# Patient Record
Sex: Male | Born: 1945 | Race: Black or African American | Hispanic: No | Marital: Married | Smoking: Never smoker
Health system: Southern US, Community
[De-identification: ages and names within clinical notes are randomized; demographics above are authoritative.]

## PROBLEM LIST (undated history)

## (undated) DIAGNOSIS — I472 Ventricular tachycardia, unspecified: Secondary | ICD-10-CM

## (undated) DIAGNOSIS — Z8639 Personal history of other endocrine, nutritional and metabolic disease: Secondary | ICD-10-CM

## (undated) DIAGNOSIS — K219 Gastro-esophageal reflux disease without esophagitis: Secondary | ICD-10-CM

## (undated) DIAGNOSIS — R7303 Prediabetes: Secondary | ICD-10-CM

## (undated) DIAGNOSIS — G629 Polyneuropathy, unspecified: Secondary | ICD-10-CM

## (undated) DIAGNOSIS — E785 Hyperlipidemia, unspecified: Secondary | ICD-10-CM

## (undated) DIAGNOSIS — I10 Essential (primary) hypertension: Secondary | ICD-10-CM

## (undated) DIAGNOSIS — G4730 Sleep apnea, unspecified: Secondary | ICD-10-CM

## (undated) DIAGNOSIS — I2510 Atherosclerotic heart disease of native coronary artery without angina pectoris: Secondary | ICD-10-CM

## (undated) DIAGNOSIS — R002 Palpitations: Secondary | ICD-10-CM

## (undated) DIAGNOSIS — R519 Headache, unspecified: Secondary | ICD-10-CM

## (undated) DIAGNOSIS — N189 Chronic kidney disease, unspecified: Secondary | ICD-10-CM

## (undated) DIAGNOSIS — I4729 Other ventricular tachycardia: Secondary | ICD-10-CM

## (undated) DIAGNOSIS — N400 Enlarged prostate without lower urinary tract symptoms: Secondary | ICD-10-CM

## (undated) HISTORY — PX: SHOULDER SURGERY: SHX246

## (undated) HISTORY — DX: Prediabetes: R73.03

## (undated) HISTORY — DX: Headache, unspecified: R51.9

## (undated) HISTORY — DX: Polyneuropathy, unspecified: G62.9

## (undated) HISTORY — DX: Atherosclerotic heart disease of native coronary artery without angina pectoris: I25.10

## (undated) HISTORY — DX: Ventricular tachycardia, unspecified: I47.20

## (undated) HISTORY — DX: Gilbert syndrome: E80.4

## (undated) HISTORY — DX: Chronic kidney disease, unspecified: N18.9

## (undated) HISTORY — DX: Other ventricular tachycardia: I47.29

## (undated) HISTORY — PX: COLON SURGERY: SHX602

## (undated) HISTORY — DX: Personal history of other endocrine, nutritional and metabolic disease: Z86.39

## (undated) HISTORY — PX: ABLATION OF DYSRHYTHMIC FOCUS: SHX254

## (undated) HISTORY — DX: Gastro-esophageal reflux disease without esophagitis: K21.9

## (undated) HISTORY — PX: NECK SURGERY: SHX720

## (undated) HISTORY — PX: COLONOSCOPY: SHX174

## (undated) HISTORY — PX: ROTATOR CUFF REPAIR: SHX139

---

## 2002-10-07 HISTORY — PX: ROTATOR CUFF REPAIR: SHX139

## 2007-08-06 ENCOUNTER — Emergency Department (HOSPITAL_COMMUNITY): Admission: EM | Admit: 2007-08-06 | Discharge: 2007-08-06 | Payer: Self-pay | Admitting: *Deleted

## 2007-10-08 HISTORY — PX: NECK SURGERY: SHX720

## 2011-02-14 ENCOUNTER — Emergency Department (HOSPITAL_COMMUNITY): Payer: BC Managed Care – PPO

## 2011-02-14 ENCOUNTER — Emergency Department (HOSPITAL_COMMUNITY)
Admission: EM | Admit: 2011-02-14 | Discharge: 2011-02-14 | Disposition: A | Discharge status: Home / Self Care | Payer: BC Managed Care – PPO | Attending: Emergency Medicine | Admitting: Emergency Medicine

## 2011-02-14 DIAGNOSIS — M549 Dorsalgia, unspecified: Secondary | ICD-10-CM | POA: Insufficient documentation

## 2011-02-14 DIAGNOSIS — R300 Dysuria: Secondary | ICD-10-CM | POA: Insufficient documentation

## 2011-02-14 DIAGNOSIS — Z79899 Other long term (current) drug therapy: Secondary | ICD-10-CM | POA: Insufficient documentation

## 2011-02-14 DIAGNOSIS — IMO0002 Reserved for concepts with insufficient information to code with codable children: Secondary | ICD-10-CM | POA: Insufficient documentation

## 2011-02-14 DIAGNOSIS — I10 Essential (primary) hypertension: Secondary | ICD-10-CM | POA: Insufficient documentation

## 2011-02-14 DIAGNOSIS — R10819 Abdominal tenderness, unspecified site: Secondary | ICD-10-CM | POA: Insufficient documentation

## 2011-02-14 DIAGNOSIS — R109 Unspecified abdominal pain: Secondary | ICD-10-CM | POA: Insufficient documentation

## 2011-02-14 LAB — URINALYSIS, ROUTINE W REFLEX MICROSCOPIC
Bilirubin Urine: NEGATIVE
Hgb urine dipstick: NEGATIVE
Ketones, ur: NEGATIVE mg/dL
Nitrite: NEGATIVE
Specific Gravity, Urine: 1.017 (ref 1.005–1.030)

## 2011-02-14 LAB — COMPREHENSIVE METABOLIC PANEL
Alkaline Phosphatase: 61 U/L (ref 39–117)
CO2: 22 mEq/L (ref 19–32)
GFR calc Af Amer: >60 mL/min (ref >60)
GFR calc non Af Amer: >60 mL/min (ref >60)
Total Bilirubin: 1.1 mg/dL (ref 0.3–1.2)
Total Protein: 7.8 g/dL (ref 6.0–8.3)

## 2011-02-14 LAB — DIFFERENTIAL
Eosinophils Absolute: 0.2 10*3/uL (ref 0.0–0.7)
Monocytes Absolute: 0.5 10*3/uL (ref 0.1–1.0)
Neutro Abs: 3.2 10*3/uL (ref 1.7–7.7)

## 2011-02-14 LAB — CBC
HCT: 39 % (ref 39.0–52.0)
Hemoglobin: 14 g/dL (ref 13.0–17.0)
MCV: 88.6 fL (ref 78.0–100.0)
Platelets: 173 10*3/uL (ref 150–400)
RDW: 12 % (ref 11.5–15.5)
WBC: 5.5 10*3/uL (ref 4.0–10.5)

## 2011-02-14 MED ORDER — IOHEXOL 300 MG/ML  SOLN
100.0000 mL | Freq: Once | INTRAMUSCULAR | Status: AC | PRN
  Administered 2011-02-14: 100 mL via INTRAVENOUS

## 2011-06-20 ENCOUNTER — Emergency Department (HOSPITAL_COMMUNITY)
Admission: EM | Admit: 2011-06-20 | Discharge: 2011-06-20 | Disposition: A | Discharge status: Home / Self Care | Payer: BC Managed Care – PPO | Attending: Emergency Medicine | Admitting: Emergency Medicine

## 2011-06-20 DIAGNOSIS — R109 Unspecified abdominal pain: Secondary | ICD-10-CM | POA: Insufficient documentation

## 2011-06-20 DIAGNOSIS — N509 Disorder of male genital organs, unspecified: Secondary | ICD-10-CM | POA: Insufficient documentation

## 2011-06-20 DIAGNOSIS — I10 Essential (primary) hypertension: Secondary | ICD-10-CM | POA: Insufficient documentation

## 2011-06-20 DIAGNOSIS — M549 Dorsalgia, unspecified: Secondary | ICD-10-CM | POA: Insufficient documentation

## 2011-06-20 DIAGNOSIS — G8929 Other chronic pain: Secondary | ICD-10-CM | POA: Insufficient documentation

## 2011-06-20 LAB — CBC
HCT: 39.6 % (ref 39.0–52.0)
MCHC: 36.4 g/dL — ABNORMAL HIGH (ref 30.0–36.0)
MCV: 88.6 fL (ref 78.0–100.0)
Platelets: 184 10*3/uL (ref 150–400)
WBC: 8.5 10*3/uL (ref 4.0–10.5)

## 2011-06-20 LAB — URINALYSIS, ROUTINE W REFLEX MICROSCOPIC
Glucose, UA: NEGATIVE mg/dL
Ketones, ur: NEGATIVE mg/dL
Specific Gravity, Urine: 1.016 (ref 1.005–1.030)
Urobilinogen, UA: 0.2 mg/dL (ref 0.0–1.0)

## 2011-06-20 LAB — BASIC METABOLIC PANEL
BUN: 13 mg/dL (ref 6–23)
Chloride: 102 mEq/L (ref 96–112)
GFR calc Af Amer: >60 mL/min (ref >60)
GFR calc non Af Amer: >60 mL/min (ref >60)
Glucose, Bld: 90 mg/dL (ref 70–99)

## 2011-06-20 LAB — DIFFERENTIAL
Eosinophils Absolute: 0.2 10*3/uL (ref 0.0–0.7)
Lymphocytes Relative: 33 % (ref 12–46)
Lymphs Abs: 2.8 10*3/uL (ref 0.7–4.0)
Monocytes Absolute: 0.6 10*3/uL (ref 0.1–1.0)
Neutro Abs: 4.8 10*3/uL (ref 1.7–7.7)

## 2011-07-17 LAB — I-STAT 8, (EC8 V) (CONVERTED LAB)
BUN: 12
Glucose, Bld: 87
HCT: 45
Hemoglobin: 15.3
Operator id: 146091
Potassium: 4.4
Sodium: 140
TCO2: 27

## 2011-07-17 LAB — URINALYSIS, ROUTINE W REFLEX MICROSCOPIC
Bilirubin Urine: NEGATIVE
Hgb urine dipstick: NEGATIVE
Ketones, ur: NEGATIVE
Nitrite: NEGATIVE
Protein, ur: NEGATIVE
Urobilinogen, UA: 0.2

## 2011-07-17 LAB — DIFFERENTIAL
Basophils Relative: 1
Neutro Abs: 5
Neutrophils Relative %: 65

## 2011-07-17 LAB — CBC
HCT: 43.3
Hemoglobin: 14.7
Platelets: 242
RDW: 12.8
WBC: 7.7

## 2011-07-17 LAB — POCT I-STAT CREATININE: Creatinine, Ser: 1.1

## 2011-10-10 ENCOUNTER — Ambulatory Visit (INDEPENDENT_AMBULATORY_CARE_PROVIDER_SITE_OTHER): Payer: BC Managed Care – PPO | Admitting: Otolaryngology

## 2011-10-10 DIAGNOSIS — J039 Acute tonsillitis, unspecified: Secondary | ICD-10-CM

## 2011-10-31 ENCOUNTER — Ambulatory Visit (INDEPENDENT_AMBULATORY_CARE_PROVIDER_SITE_OTHER): Payer: BC Managed Care – PPO | Admitting: Otolaryngology

## 2011-10-31 DIAGNOSIS — J3501 Chronic tonsillitis: Secondary | ICD-10-CM

## 2011-11-28 ENCOUNTER — Ambulatory Visit (INDEPENDENT_AMBULATORY_CARE_PROVIDER_SITE_OTHER): Payer: BC Managed Care – PPO | Admitting: Otolaryngology

## 2011-11-28 DIAGNOSIS — J3501 Chronic tonsillitis: Secondary | ICD-10-CM

## 2011-11-28 DIAGNOSIS — J351 Hypertrophy of tonsils: Secondary | ICD-10-CM

## 2011-11-28 DIAGNOSIS — R070 Pain in throat: Secondary | ICD-10-CM

## 2011-12-17 ENCOUNTER — Encounter (HOSPITAL_BASED_OUTPATIENT_CLINIC_OR_DEPARTMENT_OTHER): Payer: Self-pay | Admitting: *Deleted

## 2011-12-23 ENCOUNTER — Ambulatory Visit (HOSPITAL_BASED_OUTPATIENT_CLINIC_OR_DEPARTMENT_OTHER): Admission: RE | Admit: 2011-12-23 | Payer: BC Managed Care – PPO | Source: Ambulatory Visit | Admitting: Otolaryngology

## 2011-12-23 ENCOUNTER — Encounter (HOSPITAL_BASED_OUTPATIENT_CLINIC_OR_DEPARTMENT_OTHER): Admission: RE | Payer: Self-pay | Source: Ambulatory Visit

## 2011-12-23 HISTORY — DX: Essential (primary) hypertension: I10

## 2011-12-23 HISTORY — DX: Sleep apnea, unspecified: G47.30

## 2011-12-23 HISTORY — DX: Palpitations: R00.2

## 2011-12-23 SURGERY — TONSILLECTOMY AND ADENOIDECTOMY
Anesthesia: General | Laterality: Bilateral

## 2012-01-14 ENCOUNTER — Encounter (HOSPITAL_COMMUNITY): Payer: Self-pay | Admitting: Pharmacy Technician

## 2012-01-15 ENCOUNTER — Other Ambulatory Visit: Payer: Self-pay | Admitting: Otolaryngology

## 2012-01-22 ENCOUNTER — Encounter (HOSPITAL_COMMUNITY)
Admission: RE | Admit: 2012-01-22 | Discharge: 2012-01-22 | Disposition: A | Discharge status: Home / Self Care | Payer: Medicare Other | Source: Ambulatory Visit | Attending: Anesthesiology | Admitting: Anesthesiology

## 2012-01-22 ENCOUNTER — Encounter (HOSPITAL_COMMUNITY)
Admission: RE | Admit: 2012-01-22 | Discharge: 2012-01-22 | Disposition: A | Discharge status: Home / Self Care | Payer: Medicare Other | Source: Ambulatory Visit | Attending: Otolaryngology | Admitting: Otolaryngology

## 2012-01-22 ENCOUNTER — Encounter (HOSPITAL_COMMUNITY): Payer: Self-pay

## 2012-01-22 HISTORY — DX: Hyperlipidemia, unspecified: E78.5

## 2012-01-22 HISTORY — DX: Benign prostatic hyperplasia without lower urinary tract symptoms: N40.0

## 2012-01-22 LAB — CBC
HCT: 42.3 % (ref 39.0–52.0)
Hemoglobin: 14.7 g/dL (ref 13.0–17.0)
MCV: 90.4 fL (ref 78.0–100.0)
RBC: 4.68 MIL/uL (ref 4.22–5.81)
WBC: 5.4 10*3/uL (ref 4.0–10.5)

## 2012-01-22 LAB — BASIC METABOLIC PANEL
CO2: 27 mEq/L (ref 19–32)
Chloride: 106 mEq/L (ref 96–112)
Sodium: 140 mEq/L (ref 135–145)

## 2012-01-22 NOTE — Progress Notes (Signed)
Spoke with kim in Dr Suszanne Conners office about pt concern/want of prescription strength allergy medicine post surgery

## 2012-01-22 NOTE — Pre-Procedure Instructions (Signed)
Eric Murphy  01/22/2012   Your procedure is scheduled on:  January 29, 2012  Report to [facility] at 6:30 AM.  Call this number if you have problems the morning of surgery: [phone]   Remember:   Do not eat food:After Midnight.  May have clear liquids: up to 4 Hours before arrival.  Clear liquids include soda, tea, black coffee, apple or grape juice, broth.  Take these medicines the morning of surgery with A SIP OF WATER: NORVASC, CLARITIN       STOP ASPIRIN   Do not wear jewelry, make-up or nail polish.  Do not wear lotions, powders, or perfumes. You may wear deodorant.  Do not shave 48 hours prior to surgery.  Do not bring valuables to the hospital.  Contacts, dentures or bridgework may not be worn into surgery.  Leave suitcase in the car. After surgery it may be brought to your room.  For patients admitted to the hospital, checkout time is 11:00 AM the day of discharge.   Patients discharged the day of surgery will not be allowed to drive home.  Name and phone number of your driver: [phone] SPOUSE  Special Instructions: CHG Shower Use Special Wash: 1/2 bottle night before surgery and 1/2 bottle morning of surgery.   Please read over the following fact sheets that you were given: Pain Booklet, MRSA Information and Surgical Site Infection Prevention

## 2012-01-29 ENCOUNTER — Encounter (HOSPITAL_COMMUNITY)
Admission: RE | Disposition: A | Discharge status: Home / Self Care | Payer: Self-pay | Source: Ambulatory Visit | Attending: Otolaryngology

## 2012-01-29 ENCOUNTER — Ambulatory Visit (HOSPITAL_COMMUNITY): Payer: Medicare Other | Admitting: *Deleted

## 2012-01-29 ENCOUNTER — Encounter (HOSPITAL_COMMUNITY): Payer: Self-pay | Admitting: *Deleted

## 2012-01-29 ENCOUNTER — Ambulatory Visit (HOSPITAL_COMMUNITY)
Admission: RE | Admit: 2012-01-29 | Discharge: 2012-01-29 | Disposition: A | Discharge status: Home / Self Care | Payer: Medicare Other | Source: Ambulatory Visit | Attending: Otolaryngology | Admitting: Otolaryngology

## 2012-01-29 DIAGNOSIS — J3501 Chronic tonsillitis: Secondary | ICD-10-CM | POA: Insufficient documentation

## 2012-01-29 DIAGNOSIS — K219 Gastro-esophageal reflux disease without esophagitis: Secondary | ICD-10-CM | POA: Insufficient documentation

## 2012-01-29 DIAGNOSIS — Z01818 Encounter for other preprocedural examination: Secondary | ICD-10-CM | POA: Insufficient documentation

## 2012-01-29 DIAGNOSIS — Z01812 Encounter for preprocedural laboratory examination: Secondary | ICD-10-CM | POA: Insufficient documentation

## 2012-01-29 DIAGNOSIS — J351 Hypertrophy of tonsils: Secondary | ICD-10-CM

## 2012-01-29 DIAGNOSIS — Z9089 Acquired absence of other organs: Secondary | ICD-10-CM

## 2012-01-29 DIAGNOSIS — Z01810 Encounter for preprocedural cardiovascular examination: Secondary | ICD-10-CM | POA: Insufficient documentation

## 2012-01-29 DIAGNOSIS — I10 Essential (primary) hypertension: Secondary | ICD-10-CM | POA: Insufficient documentation

## 2012-01-29 HISTORY — PX: TONSILLECTOMY: SHX5217

## 2012-01-29 SURGERY — TONSILLECTOMY
Anesthesia: General | Site: Mouth | Laterality: Bilateral | Wound class: Clean Contaminated

## 2012-01-29 MED ORDER — PROPOFOL 10 MG/ML IV EMUL
INTRAVENOUS | Status: DC | PRN
  Administered 2012-01-29: 200 mg via INTRAVENOUS

## 2012-01-29 MED ORDER — DEXAMETHASONE SODIUM PHOSPHATE 4 MG/ML IJ SOLN
INTRAMUSCULAR | Status: DC | PRN
  Administered 2012-01-29: 8 mg via INTRAVENOUS

## 2012-01-29 MED ORDER — LIDOCAINE HCL (CARDIAC) 20 MG/ML IV SOLN
INTRAVENOUS | Status: DC | PRN
  Administered 2012-01-29: 50 mg via INTRAVENOUS

## 2012-01-29 MED ORDER — SUCCINYLCHOLINE CHLORIDE 20 MG/ML IJ SOLN
INTRAMUSCULAR | Status: DC | PRN
  Administered 2012-01-29: 100 mg via INTRAVENOUS

## 2012-01-29 MED ORDER — SODIUM CHLORIDE 0.9 % IR SOLN
Status: DC | PRN
  Administered 2012-01-29: 1000 mL

## 2012-01-29 MED ORDER — FENTANYL CITRATE 0.05 MG/ML IJ SOLN
INTRAMUSCULAR | Status: DC | PRN
  Administered 2012-01-29: 50 ug via INTRAVENOUS
  Administered 2012-01-29: 150 ug via INTRAVENOUS
  Administered 2012-01-29: 50 ug via INTRAVENOUS

## 2012-01-29 MED ORDER — OXYMETAZOLINE HCL 0.05 % NA SOLN
NASAL | Status: DC | PRN
  Administered 2012-01-29: 1

## 2012-01-29 MED ORDER — HYDROMORPHONE HCL PF 1 MG/ML IJ SOLN
0.2500 mg | INTRAMUSCULAR | Status: DC | PRN
  Administered 2012-01-29 (×2): 0.25 mg via INTRAVENOUS

## 2012-01-29 MED ORDER — ACETAMINOPHEN 10 MG/ML IV SOLN
INTRAVENOUS | Status: AC
  Filled 2012-01-29: qty 100

## 2012-01-29 MED ORDER — LABETALOL HCL 5 MG/ML IV SOLN
INTRAVENOUS | Status: DC | PRN
  Administered 2012-01-29: 5 mg via INTRAVENOUS

## 2012-01-29 MED ORDER — MIDAZOLAM HCL 5 MG/5ML IJ SOLN
INTRAMUSCULAR | Status: DC | PRN
  Administered 2012-01-29: 2 mg via INTRAVENOUS

## 2012-01-29 MED ORDER — ACETAMINOPHEN 10 MG/ML IV SOLN
INTRAVENOUS | Status: DC | PRN
  Administered 2012-01-29: 1000 mg via INTRAVENOUS

## 2012-01-29 MED ORDER — LACTATED RINGERS IV SOLN
INTRAVENOUS | Status: DC | PRN
  Administered 2012-01-29: 08:00:00 via INTRAVENOUS

## 2012-01-29 MED ORDER — ONDANSETRON HCL 4 MG/2ML IJ SOLN
INTRAMUSCULAR | Status: DC | PRN
  Administered 2012-01-29: 4 mg via INTRAVENOUS

## 2012-01-29 MED ORDER — CEFAZOLIN SODIUM 1-5 GM-% IV SOLN
INTRAVENOUS | Status: AC
  Filled 2012-01-29: qty 100

## 2012-01-29 SURGICAL SUPPLY — 24 items
CANISTER SUCTION 2500CC (MISCELLANEOUS) ×3 IMPLANT
CATH ROBINSON RED A/P 10FR (CATHETERS) ×3 IMPLANT
CLOTH BEACON ORANGE TIMEOUT ST (SAFETY) ×3 IMPLANT
ELECT REM PT RETURN 9FT ADLT (ELECTROSURGICAL) ×3
ELECT REM PT RETURN 9FT PED (ELECTROSURGICAL)
ELECTRODE REM PT RETRN 9FT PED (ELECTROSURGICAL) IMPLANT
ELECTRODE REM PT RTRN 9FT ADLT (ELECTROSURGICAL) ×2 IMPLANT
GAUZE SPONGE 4X4 16PLY XRAY LF (GAUZE/BANDAGES/DRESSINGS) ×3 IMPLANT
GLOVE BIO SURGEON STRL SZ7.5 (GLOVE) ×3 IMPLANT
GLOVE SURG SS PI 6.5 STRL IVOR (GLOVE) ×3 IMPLANT
GOWN STRL NON-REIN LRG LVL3 (GOWN DISPOSABLE) ×6 IMPLANT
KIT BASIN OR (CUSTOM PROCEDURE TRAY) ×3 IMPLANT
KIT ROOM TURNOVER OR (KITS) ×3 IMPLANT
NS IRRIG 1000ML POUR BTL (IV SOLUTION) ×3 IMPLANT
PACK SURGICAL SETUP 50X90 (CUSTOM PROCEDURE TRAY) ×3 IMPLANT
PAD ARMBOARD 7.5X6 YLW CONV (MISCELLANEOUS) ×6 IMPLANT
SPECIMEN JAR SMALL (MISCELLANEOUS) ×6 IMPLANT
SPONGE TONSIL 1 RF SGL (DISPOSABLE) ×3 IMPLANT
SYR BULB 3OZ (MISCELLANEOUS) ×3 IMPLANT
TOWEL OR 17X24 6PK STRL BLUE (TOWEL DISPOSABLE) ×6 IMPLANT
TUBE CONNECTING 12X1/4 (SUCTIONS) ×3 IMPLANT
TUBE SALEM SUMP 12R W/ARV (TUBING) IMPLANT
WAND COBLATOR 70 EVAC XTRA (SURGICAL WAND) ×3 IMPLANT
WATER STERILE IRR 1000ML POUR (IV SOLUTION) IMPLANT

## 2012-01-29 NOTE — Anesthesia Preprocedure Evaluation (Addendum)
Anesthesia Evaluation  Patient identified by MRN, date of birth, ID band Patient awake    Reviewed: Allergy & Precautions, H&P , NPO status , Patient's Chart, lab work & pertinent test results  Airway Mallampati: II TM Distance: >3 FB Neck ROM: Full    Dental No notable dental hx. (+) Teeth Intact and Dental Advisory Given   Pulmonary neg pulmonary ROS, sleep apnea ,  breath sounds clear to auscultation  Pulmonary exam normal       Cardiovascular hypertension, Pt. on medications negative cardio ROS  Rhythm:Regular Rate:Normal     Neuro/Psych negative neurological ROS  negative psych ROS   GI/Hepatic negative GI ROS, Neg liver ROS,   Endo/Other  negative endocrine ROS  Renal/GU negative Renal ROS  negative genitourinary   Musculoskeletal negative musculoskeletal ROS (+)   Abdominal   Peds  Hematology negative hematology ROS (+)   Anesthesia Other Findings   Reproductive/Obstetrics negative OB ROS                          Anesthesia Physical Anesthesia Plan  ASA: III  Anesthesia Plan: General   Post-op Pain Management:    Induction: Intravenous  Airway Management Planned: Oral ETT  Additional Equipment:   Intra-op Plan:   Post-operative Plan: Extubation in OR  Informed Consent: I have reviewed the patients History and Physical, chart, labs and discussed the procedure including the risks, benefits and alternatives for the proposed anesthesia with the patient or authorized representative who has indicated his/her understanding and acceptance.   Dental advisory given  Plan Discussed with: Anesthesiologist and Surgeon  Anesthesia Plan Comments:        Anesthesia Quick Evaluation

## 2012-01-29 NOTE — Op Note (Signed)
DATE OF PROCEDURE:  01/29/2012                              OPERATIVE REPORT  SURGEON:  Newman Pies, MD  PREOPERATIVE DIAGNOSES: 1. Tonsillar hypertrophy. 2. Chronic tonsillitis and pharyngitis  POSTOPERATIVE DIAGNOSES: 1. Tonsillar hypertrophy. 2. Chronic tonsillitis and pharyngitis  PROCEDURE PERFORMED:  Adult tonsillectomy.  ANESTHESIA:  General endotracheal tube anesthesia.  COMPLICATIONS:  None.  ESTIMATED BLOOD LOSS:  Minimal.  INDICATION FOR PROCEDURE:  Eric Murphy is a 66 y.o. male with a history of chronic tonsillitis/pharyngitis and halitisis.  According to the patient, He has been experiencing chronic throat discomfort with halitosis for several years. The patient continues to be symptomatic despite medical treatments. On examination, the patient was noted to have bilateral cryptic tonsils, with numerous tonsilloliths. Based on the above findings, the decision was made for the patient to undergo the tonsillectomy procedure. Likelihood of success in reducing symptoms was also discussed.  The risks, benefits, alternatives, and details of the procedure were discussed with the mother.  Questions were invited and answered.  Informed consent was obtained.  DESCRIPTION:  The patient was taken to the operating room and placed supine on the operating table.  General endotracheal tube anesthesia was administered by the anesthesiologist.  The patient was positioned and prepped and draped in a standard fashion for adenotonsillectomy.  A Crowe-Davis mouth gag was inserted into the oral cavity for exposure. 2+ cryptic tonsils were noted bilaterally.  No bifidity was noted.  Indirect mirror examination of the nasopharynx revealed no adenoid hypertrophy.  The right tonsil was then grasped with a straight Allis clamp and retracted medially.  It was resected free from the underlying pharyngeal constrictor muscles with the Coblator device.  The same procedure was repeated on the left side without  exception.  The surgical sites were copiously irrigated.  The mouth gag was removed.  The care of the patient was turned over to the anesthesiologist.  The patient was awakened from anesthesia without difficulty.  The patient was extubated and transferred to the recovery room in good condition.  OPERATIVE FINDINGS:  Tonsillar hypertrophy.  SPECIMEN:  Bilateral tonsils  FOLLOWUP CARE:  The patient will be discharged home once awake and alert.  He will be placed on amoxicillin 800 mg p.o. b.i.d. for 5 days, and Roxicet 5-93ml po q 4-6 hours for postop pain control.   The patient will follow up in my office in approximately 2 weeks.  [name],SUI W 01/29/2012 9:30 AM

## 2012-01-29 NOTE — H&P (Signed)
Cc: Chronic sore throat  HPI: The patient returns today for a follow up evaluation. He was last seen on 10/31/11. At that time, he was noted to have mild chronic tonsillitis with tonsillolith accumulation. No suspicious mass or lesion was noted. He was treated with Clindamycin 300mg  QID for 10 days and Mary's magic mouthwash. The patient returns today complaining of persistent throat pain and mild odynophagia. He completed the Clindamycin without difficulty. No dysphagia is noted. The patient's previous tongue biopsy was negative. He denies the use of tobacco. No other ENT, GI, or respiratory issue noted since the last visit.   The patient's review of systems (constitutional, eyes, ENT, cardiovascular, respiratory, GI, musculoskeletal, skin, neurologic, psychiatric, endocrine, hematologic, allergic) is noted in the ROS questionnaire.  It is reviewed with the patient.    Past Medical History (Major events, hospitalizations, surgeries):  Right shoulder rotator cuff, cervical fusion, cardiac ablation.  Known allergies: Hydrochlorithiazide, Simvatatin, Lisinopril, Lipitor, Beta blockers, Shell fish.     Ongoing medical problems: Hypertension, chest pain, reflux, allergies, hay fever.     Family medical history: Diabetes, heart disease.     Social history: The patient is married. He denies the use of alcohol, tobacco or illegal drugs.  Exam: General: Communicates without difficulty, well nourished, no acute distress. Head: Normocephalic, no evidence injury, no tenderness, facial buttresses intact without stepoff. Eyes: PERRL, EOMI. No scleral icterus, conjunctivae clear. Neuro: CN II exam reveals vision grossly intact. No nystagmus at any point of gaze. Ears: Auricles well formed without lesions. Ear canals are intact without mass or lesion. No erythema or edema is appreciated. The TMs are intact without fluid. Nose: External evaluation reveals normal support and skin without lesions. Dorsum is intact.  Anterior rhinoscopy reveals healthy pink mucosa over anterior aspect of inferior turbinates and intact septum. No purulence noted. Oral:  Oral cavity and oropharynx are intact, symmetric, without erythema or edema. Mucosa is moist without lesions. Tonsils are 2+ and cryptic. Tonsils with multiple tonsilloliths. Neck: Full range of motion without pain. There is no significant lymphadenopathy. No masses palpable.  Thyroid bed within normal limits to palpation. Parotid glands and submandibular glands equal bilaterally without mass. Trachea is midline.  Neuro:  CN 2-12 grossly intact. Gait normal.  A: 1. Chronic tonsillitis with moderate tonsillar hypertrophy and tonsillolith accumulation. 2. Normal laryngoscopy findings. No suspicious mass/lesion or laryngeal edema/erythema noted.  P: 1. The laryngoscopy and physical exam findings are discussed with the patient. 2. The option of tonsillectomy is discussed with the patient. The risks, benefits, alternatives, and details of the procedure are also reviewed. Questions are invited and answered.   3. The patient would like to proceed with the adenotonsillectomy procedure.  Leander Tout Philomena Doheny, MD

## 2012-01-29 NOTE — Anesthesia Postprocedure Evaluation (Signed)
  Anesthesia Post-op Note  Patient: Systems analyst  Procedure(s) Performed: Procedure(s) (LRB): TONSILLECTOMY (Bilateral)  Patient Location: PACU  Anesthesia Type: General  Level of Consciousness: awake  Airway and Oxygen Therapy: Patient Spontanous Breathing and Patient connected to nasal cannula oxygen  Post-op Pain: moderate  Post-op Assessment: Post-op Vital signs reviewed, Patient's Cardiovascular Status Stable, Respiratory Function Stable and Patent Airway  Post-op Vital Signs: Reviewed and stable  Complications: No apparent anesthesia complications

## 2012-01-29 NOTE — Brief Op Note (Signed)
01/29/2012  9:28 AM  PATIENT:  Eric Murphy  66 y.o. male  PRE-OPERATIVE DIAGNOSIS:  Tonsillar hypertrophy and chronic tonsillitis  POST-OPERATIVE DIAGNOSIS:  Tonsillar hypertrophy and chronic tonsillitis  PROCEDURE:  Procedure(s) (LRB): TONSILLECTOMY (Bilateral)  SURGEON:  Surgeon(s) and Role:    * Darletta Moll, MD - Primary  PHYSICIAN ASSISTANT:   ASSISTANTS: none   ANESTHESIA:   general  EBL:  Total I/O In: 800 [I.V.:800] Out: -   BLOOD ADMINISTERED:none  DRAINS: none   LOCAL MEDICATIONS USED:  NONE  SPECIMEN:  Bilateral tonsils  DISPOSITION OF SPECIMEN:  PATHOLOGY  COUNTS:  YES  TOURNIQUET:  * No tourniquets in log *  DICTATION: .Note written in EPIC  PLAN OF CARE: Discharge to home after PACU  PATIENT DISPOSITION:  PACU - hemodynamically stable.   Delay start of Pharmacological VTE agent (>24hrs) due to surgical blood loss or risk of bleeding: not applicable

## 2012-01-29 NOTE — Preoperative (Signed)
Beta Blockers   Reason not to administer Beta Blockers:Not Applicable 

## 2012-01-29 NOTE — Discharge Instructions (Addendum)
[facility] Postoperative Instructions for Tonsillectomy & Adenoidectomy (T&A) Activity Restrict activity at home for the first two days, resting as much as possible. Light indoor activity is best. You may usually return to school or work within a week but void strenuous activity and sports for two weeks. Sleep with your head elevated on 2-3 pillows for 3-4 days to help decrease swelling. Diet Due to tissue swelling and throat discomfort, you may have little desire to drink for several days. However fluids are very important to prevent dehydration. You will find that non-acidic juices, soups, popsicles, Jell-O, custard, puddings, and any soft or mashed foods taken in small quantities can be swallowed fairly easily. Try to increase your fluid and food intake as the discomfort subsides. It is recommended that a child receive 1-1/2 quarts of fluid in a 24-hour period. Adult require twice this amount.  Discomfort Your sore throat may be relieved by applying an ice collar to your neck and/or by taking Tylenol. You may experience an earache, which is due to referred pain from the throat. Referred ear pain is commonly felt at night when trying to rest.  Bleeding                        Although rare, there is risk of having some bleeding during the first 2 weeks after having a T&A. This usually happens between days 7-10 postoperatively. If you or your child should have any bleeding, try to remain calm. We recommend sitting up quietly in a chair and gently spitting out the blood into a bowl. For adults, gargling gently with ice water may help. If the bleeding does not stop after a short time (5 minutes), is more than 1 teaspoonful, or if you become worried, please call our office at [phone] or go directly to the nearest hospital emergency room. Do not eat or drink anything prior to going to the hospital as you may need to be taken to the operating room in order to control the bleeding. GENERAL  CONSIDERATIONS 1. Brush your teeth regularly. Avoid mouthwashes and gargles for three weeks. You may gargle gently with warm salt-water as necessary or spray with Chloraseptic. You may make salt-water by placing 2 teaspoons of table salt into a quart of fresh water. Warm the salt-water in a microwave to a luke warm temperature.  2. Avoid exposure to colds and upper respiratory infections if possible.  3. If you look into a mirror or into your child's mouth, you will see white-gray patches in the back of the throat. This is normal after having a T&A and is like a scab that forms on the skin after an abrasion. It will disappear once the back of the throat heals completely. However, it may cause a noticeable odor; this too will disappear with time. Again, warm salt-water gargles may be used to help keep the throat clean and promote healing.  4. You may notice a temporary change in voice quality, such as a higher pitched voice or a nasal sound, until healing is complete. This may last for 1-2 weeks and should resolve.  5. Do not take or give you child any medications that we have not prescribed or recommended.  6. Snoring may occur, especially at night, for the first week after a T&A. It is due to swelling of the soft palate and will usually resolve.  Please call our office at [phone] if you have any questions.       Instructions Following General Anesthetic, Adult A nurse specialized in giving anesthesia (anesthetist) or a doctor specialized in giving anesthesia (anesthesiologist) gave you a medicine that made you sleep while a procedure was performed. For as long as 24 hours following this procedure, you may feel:  Dizzy.   Weak.   Drowsy.  AFTER THE PROCEDURE After surgery, you will be taken to the recovery area where a nurse will monitor your progress. You will be allowed to go home when you are awake, stable, taking fluids well, and without complications. For the first 24 hours following an  anesthetic:  Have a responsible person with you.   Do not drive a car. If you are alone, do not take public transportation.   Do not drink alcohol.   Do not take medicine that has not been prescribed by your caregiver.   Do not sign important papers or make important decisions.   You may resume normal diet and activities as directed.   Change bandages (dressings) as directed.   Only take over-the-counter or prescription medicines for pain, discomfort, or fever as directed by your caregiver.  If you have questions or problems that seem related to the anesthetic, call the hospital and ask for the anesthetist or anesthesiologist on call. SEEK IMMEDIATE MEDICAL CARE IF:   You develop a rash.   You have difficulty breathing.   You have chest pain.   You develop any allergic problems.  Document Released: 12/30/2000 Document Revised: 09/12/2011 Document Reviewed: 08/10/2007 Exit[employer] Patient Information 2012 [employer]. 

## 2012-01-29 NOTE — Transfer of Care (Signed)
Immediate Anesthesia Transfer of Care Note  Patient: Systems analyst  Procedure(s) Performed: Procedure(s) (LRB): TONSILLECTOMY (Bilateral)  Patient Location: PACU  Anesthesia Type: General  Level of Consciousness: awake, oriented and patient cooperative  Airway & Oxygen Therapy: Patient Spontanous Breathing and Patient connected to nasal cannula oxygen  Post-op Assessment: Report given to PACU RN, Post -op Vital signs reviewed and stable and Patient moving all extremities X 4  Post vital signs: Reviewed and stable  Complications: No apparent anesthesia complications

## 2012-01-30 ENCOUNTER — Encounter (HOSPITAL_COMMUNITY): Payer: Self-pay | Admitting: Otolaryngology

## 2012-02-13 ENCOUNTER — Ambulatory Visit (INDEPENDENT_AMBULATORY_CARE_PROVIDER_SITE_OTHER): Payer: Medicare Other | Admitting: Otolaryngology

## 2012-09-09 IMAGING — CT CT ABD-PELV W/ CM
2 of 4 series · 14 of 32 positions shown, 19 images · IV contrast (agent unspecified)
Comparison: None.

CLINICAL DATA: Lower abdominal pain radiating to the back for 3
months.

CT ABDOMEN AND PELVIS WITH CONTRAST
TECHNIQUE: Multidetector CT imaging of the abdomen and pelvis was
performed using the standard protocol during bolus administration
of intravenous contrast.
Contrast:  Pmnipaque-244, 100 ml.

[Series 2: routine abdomen · axial · 0.76mm/px · z∈[-468,-103]mm · 8 of 95 slices shown, 13 images]
[im 11/95  soft-tissue]
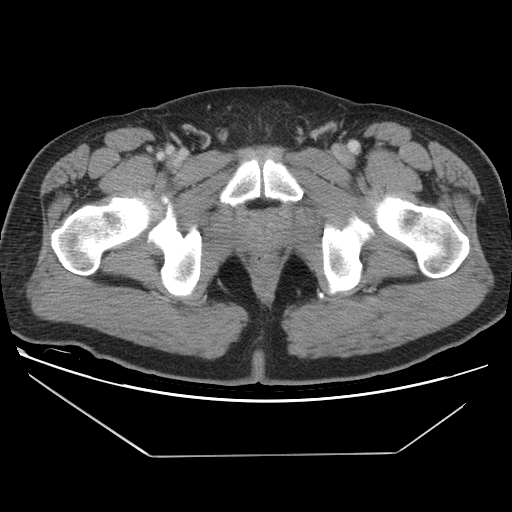
[im 11/95  bone]
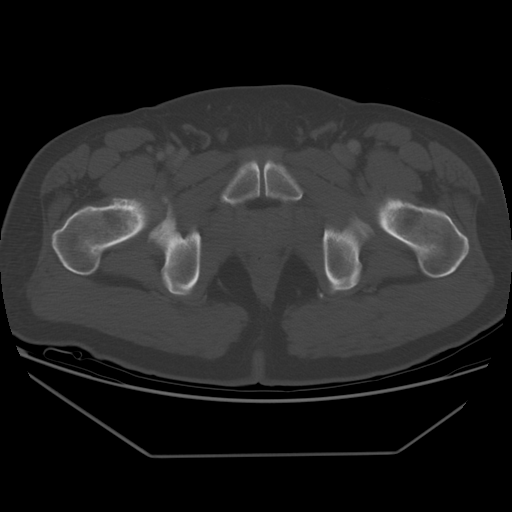
[im 21/95  soft-tissue]
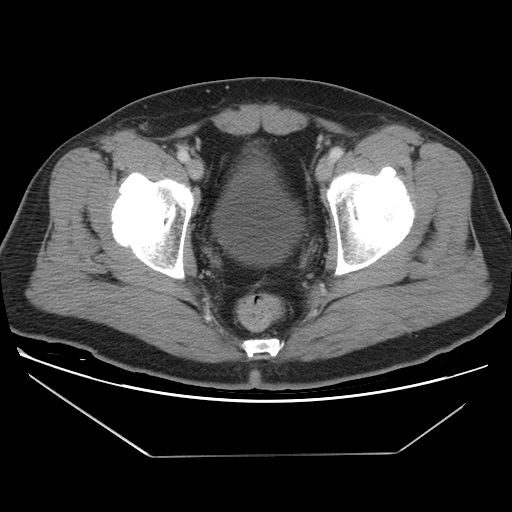
[im 32/95  soft-tissue]
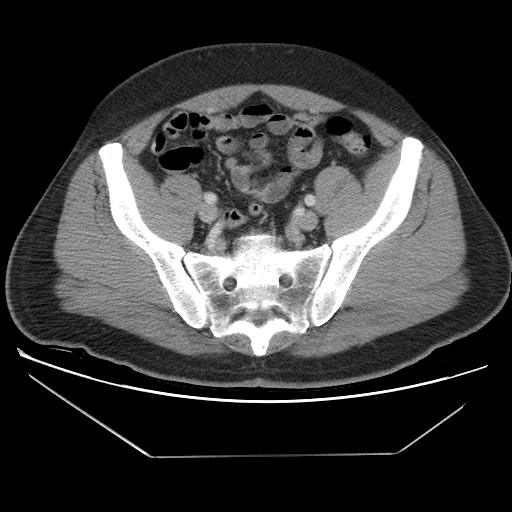
[im 42/95  soft-tissue]
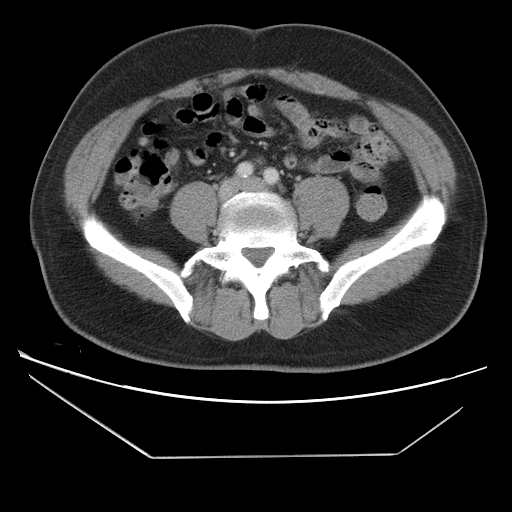
[im 53/95  soft-tissue]
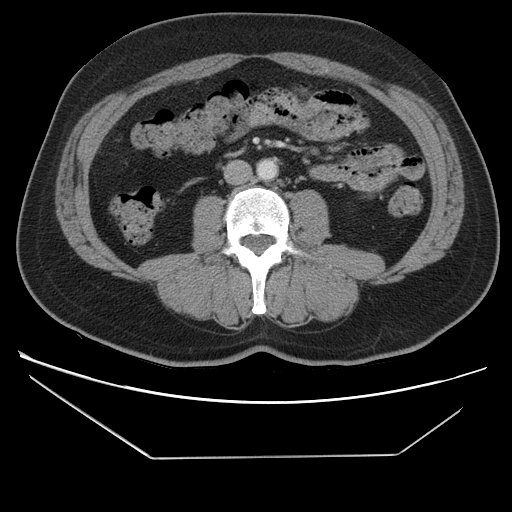
[im 53/95  lung]
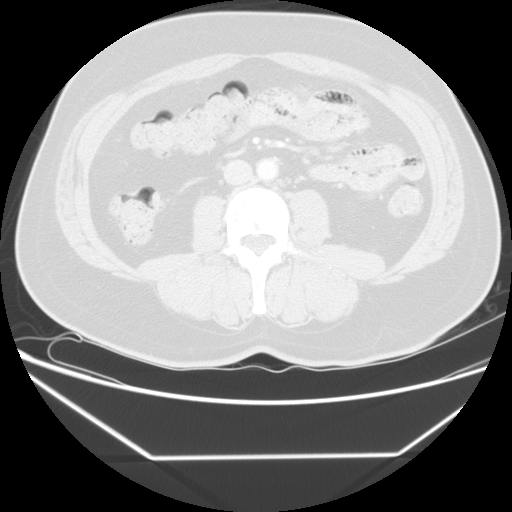
[im 63/95  soft-tissue]
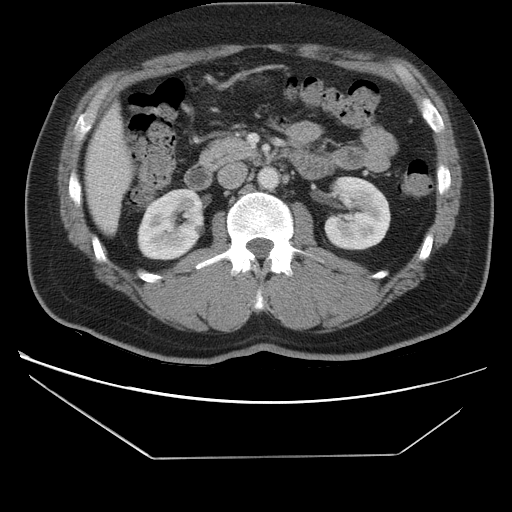
[im 63/95  lung]
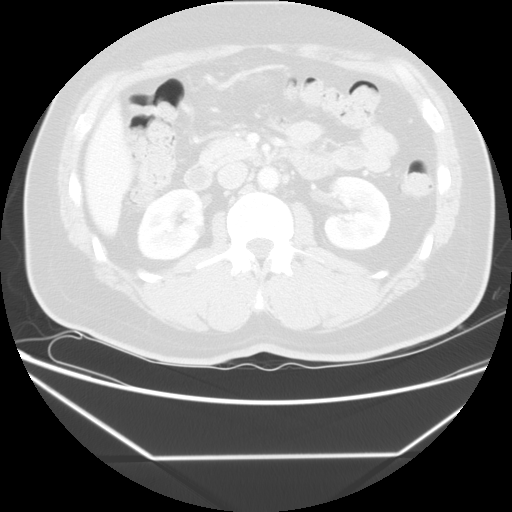
[im 74/95  soft-tissue]
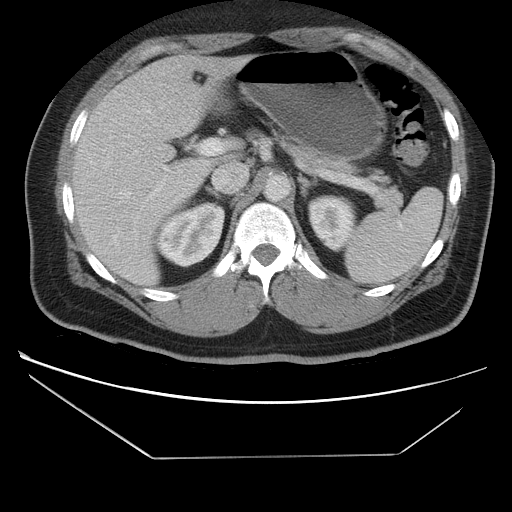
[im 74/95  lung]
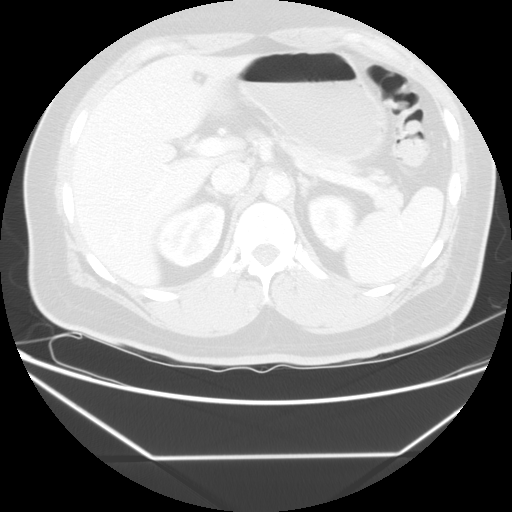
[im 84/95  soft-tissue]
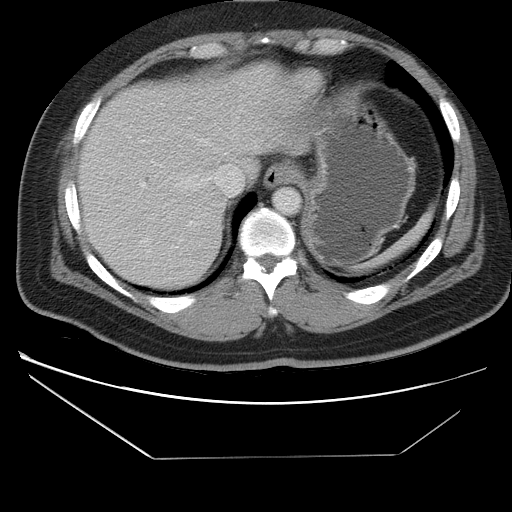
[im 84/95  lung]
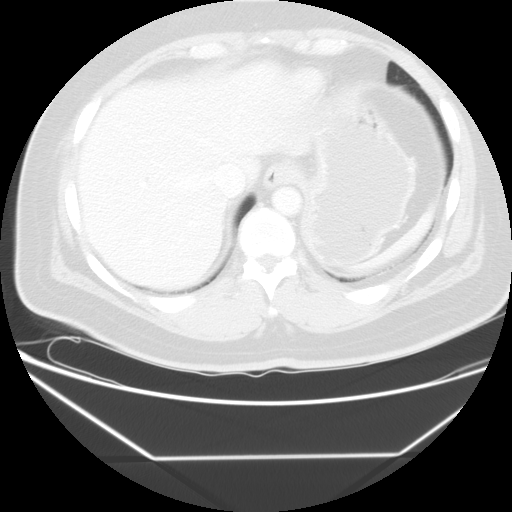

[Series 401: sag · sagittal · 0.98mm/px · 6 of 105 slices shown]
[im 11/105  soft-tissue]
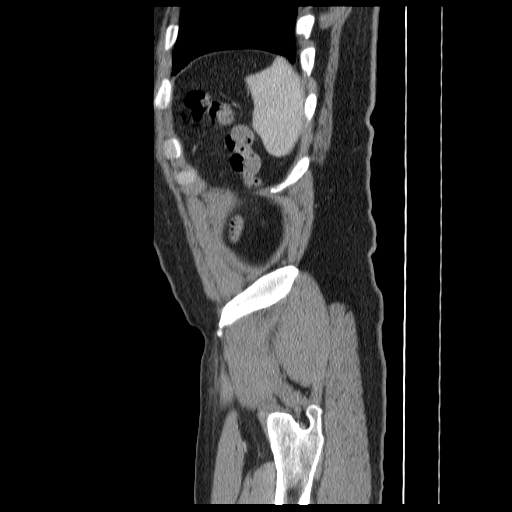
[im 21/105  soft-tissue]
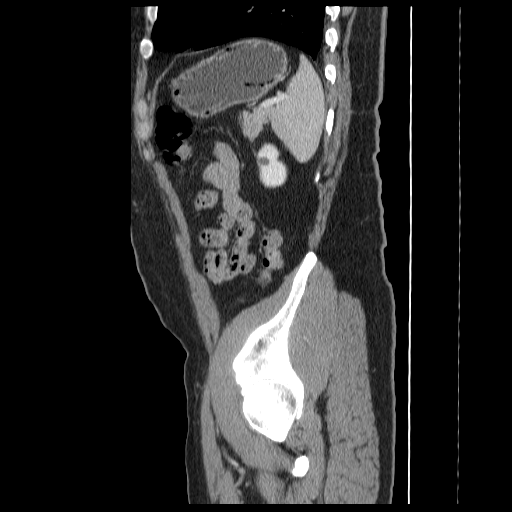
[im 32/105  soft-tissue]
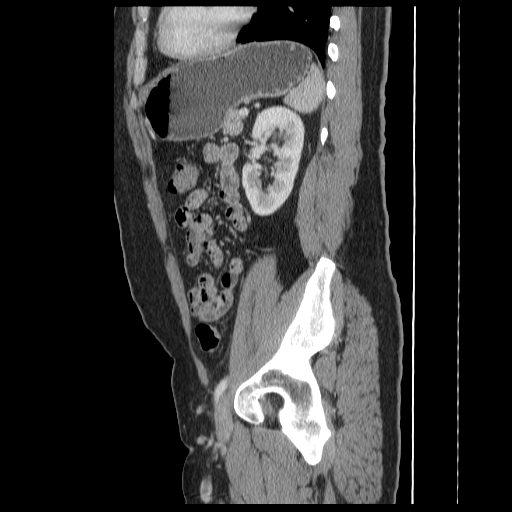
[im 42/105  soft-tissue]
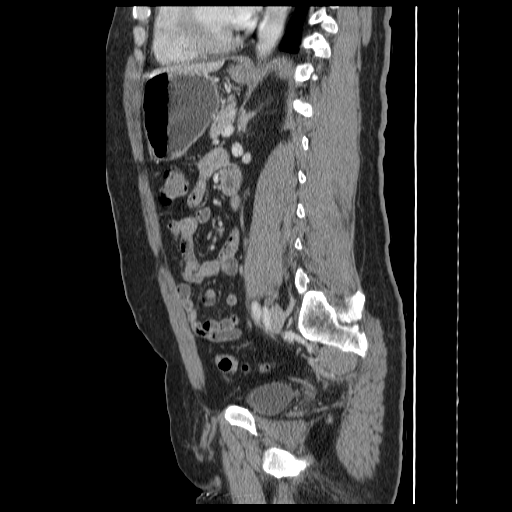
[im 63/105  soft-tissue]
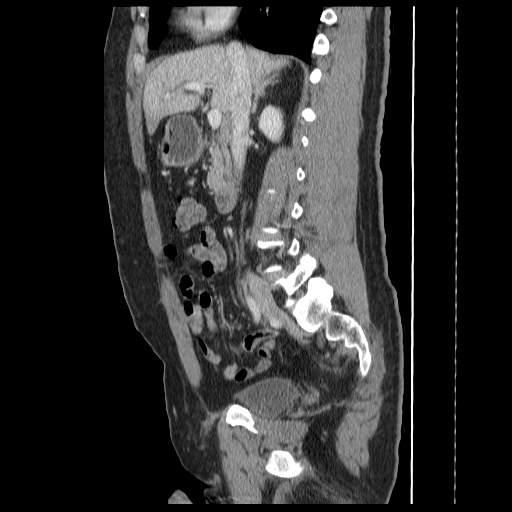
[im 73/105  soft-tissue]
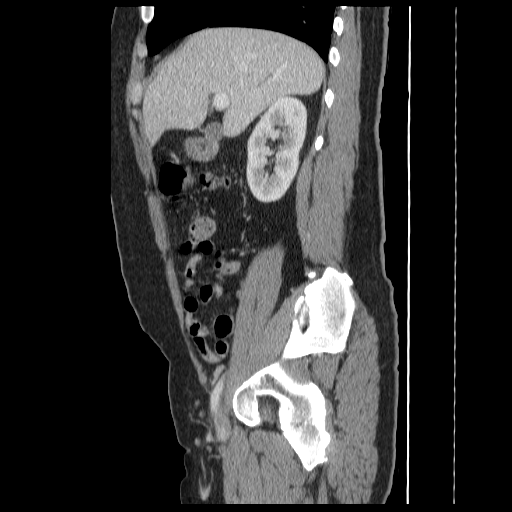

[14 of 32 positions shown; findings below may reference images not displayed]

FINDINGS: Liver, spleen, pancreas, adrenal glands, and kidneys
normal. Small simple cyst projects laterally from left kidney,
roughly 1 cm in diameter.  Gallbladder unremarkable by CT.  No
biliary ductal dilation.  Stomach and visualized large and small
bowel unremarkable.  Abdominal aorta normal in caliber.   Minimal
nonaneurysmal atherosclerotic calcification of the aortoiliac
system.  No significant lymphadenopathy.  No free fluid.
Visualized lung bases clear.

Appendix identified and normal.  Visualized colon and small bowel
unremarkable. Diverticulosis without signs of diverticulitis.  No
free fluid. Prostate mildly enlarged but within normal limits for
age.  No significant lymphadenopathy. Urinary bladder normal. Clips
in the scrotum may represent previous varicocele treatment or
vasectomy procedure.  Mild lumbar spondylosis.
IMPRESSION: Unremarkable CT of the abdomen and pelvis.

## 2016-10-07 HISTORY — PX: CATARACT EXTRACTION: SUR2

## 2020-10-07 HISTORY — PX: CHOLECYSTECTOMY: SHX55

## 2021-11-21 ENCOUNTER — Encounter: Payer: Self-pay | Admitting: *Deleted

## 2021-11-21 ENCOUNTER — Other Ambulatory Visit: Payer: Self-pay | Admitting: *Deleted

## 2021-11-22 ENCOUNTER — Encounter: Payer: Self-pay | Admitting: Psychiatry

## 2021-11-22 ENCOUNTER — Ambulatory Visit: Payer: Medicare Other | Admitting: Psychiatry

## 2021-11-22 VITALS — BP 135/75 | HR 72 | Ht 72.0 in | Wt 224.8 lb

## 2021-11-22 DIAGNOSIS — M5481 Occipital neuralgia: Secondary | ICD-10-CM

## 2021-11-22 DIAGNOSIS — R519 Headache, unspecified: Secondary | ICD-10-CM

## 2021-11-22 DIAGNOSIS — M542 Cervicalgia: Secondary | ICD-10-CM | POA: Diagnosis not present

## 2021-11-22 NOTE — Patient Instructions (Signed)
Physical therapy Blood work to look for inflammation

## 2021-11-22 NOTE — Progress Notes (Signed)
Referring:  Sherrilee Gilles, [place] [address]  PCP: Pcp, No  Neurology was asked to evaluate Eric Murphy, a 76 year old male for a chief complaint of headaches.  Our recommendations of care will be communicated by shared medical record.    CC:  headaches  HPI:  Medical co-morbidities: HTN, HLD, CAD, neuropathy, CKD, Gilbert's syndrome, cervical stenosis s/p fusion, lumbar stenosis s/p fusion  The patient presents for evaluation of headaches which began one year ago. They are described as sharp pain radiating from his occiput to behind his left eye. Sharp pain lasts for 2-3 minutes at a time. Has these 2-3 times per month. Does not take anything for them because they do not last for very long.  He also reports frontal headaches which occur once per week. These headaches can last all day. They are not associated with photophobia, phonophobia, or nausea. Takes tylenol as needed which does help.  He has a history of cervical stenosis s/p fusion. States neck pain improved significantly after his fusion. He did do physical therapy after his surgery which he found helpful.   Headache History: Onset: 1 year Triggers: neck tension Aura: no Location: left occiput radiating up the side of his head, frontal Quality/Description: Severity: 8/10 Associated Symptoms:  Photophobia: no  Phonophobia: no  Nausea: no Worse with activity?: Duration of headaches: 2-3 minutes, frontal headaches can last all day  Headache days per month: 7 Headache free days per month: 23  Current Treatment: Abortive Tylenol  Preventative none  Prior Therapies                                 Cymbalta 30 mg daily - helped for pain Losartan 100 mg daily   Headache Risk Factors: Headache risk factors and/or co-morbidities (-) Neck Pain (+) Back Pain (-) History of Motor Vehicle Accident (-) History of Traumatic Brain Injury and/or Concussion   LABS: CBC    Component Value  Date/Time   WBC 5.4 01/22/2012 1031   RBC 4.68 01/22/2012 1031   HGB 14.7 01/22/2012 1031   HCT 42.3 01/22/2012 1031   PLT 170 01/22/2012 1031   MCV 90.4 01/22/2012 1031   MCH 31.4 01/22/2012 1031   MCHC 34.8 01/22/2012 1031   RDW 12.5 01/22/2012 1031   LYMPHSABS 2.8 06/20/2011 1835   MONOABS 0.6 06/20/2011 1835   EOSABS 0.2 06/20/2011 1835   BASOSABS 0.1 06/20/2011 1835   CMP Latest Ref Rng & Units 01/22/2012 06/20/2011 02/14/2011  Glucose 70 - 99 mg/dL 99 90 87  BUN 6 - 23 mg/dL _0 Creatinine 0.50 - 1.35 mg/dL 0.97 1.04 1.12  Sodium 135 - 145 mEq/L 140 138 134(L)  Potassium 3.5 - 5.1 mEq/L 4.5 3.8 4.4  Chloride 96 - 112 mEq/L 106 102 104  CO2 19 - 32 mEq/L _1 Calcium 8.4 - 10.5 mg/dL 9.4 9.9 9.8  Total Protein 6.0 - 8.3 g/dL - - 7.8  Total Bilirubin 0.3 - 1.2 mg/dL - - 1.1  Alkaline Phos 39 - 117 U/L - - 61  AST 0 - 37 U/L - - 33  ALT 0 - 53 U/L - - 43     IMAGING:  Patient reports he had an 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     aspirin 81 MG EC tablet Take 1 tablet by mouth daily.     atorvastatin (LIPITOR) 20 MG tablet 20 mg daily.     Coenzyme Q10 100 MG capsule Take 200 mg by mouth daily.     DULoxetine (CYMBALTA) 30 MG capsule Take 30 mg by mouth daily.     famotidine (PEPCID) 20 MG tablet Take 20 mg by mouth 2 (two) times daily.     fluticasone (FLONASE) 50 MCG/ACT nasal spray Place into the nose as needed.     losartan (COZAAR) 100 MG tablet Take 100 mg by mouth daily.     Multiple Vitamins-Minerals (CENTRUM SILVER PO) Take 1 tablet by mouth daily.     sildenafil (VIAGRA) 100 MG tablet TAKE ONE TABLET BY MOUTH  AS NEEDED DO NOT TAKE MORE THAN ONCE DAILY UNLESS OTHERWISE INSTRUCTED     Vitamin D, Ergocalciferol, (DRISDOL) 50000 UNITS CAPS Take 50,000 Units by mouth every 7 (seven)  days. On Monday     No current facility-administered medications on file prior to visit.     Allergies: Allergies  Allergen Reactions   Shrimp [Shellfish Allergy] Anaphylaxis    IODINE IN SHRIMP GIVES THE PATIENT ANAPHYLAXIS   Beta Adrenergic Blockers Other (See Comments)    Heart rate is too low   Hydrochlorothiazide Other (See Comments)    palpitations   Lipitor [Atorvastatin Calcium] Other (See Comments)    Leg muscle cramps   Lisinopril Cough   Primaquine Other (See Comments)    Caused Jaundice   Simvastatin Other (See Comments)    Leg muscle cramps    Family History: Family History  Problem Relation Age of Onset   Heart failure Father      Past Medical History: Past Medical History:  Diagnosis Date   BPH (benign prostatic hypertrophy)    CAD (coronary artery disease)    CKD (chronic kidney disease)    Gilbert's syndrome    H/O vitamin D deficiency    Headache    Hyperlipemia    Hypertension    Neuropathy    Rapid palpitations    HAD ABLATION 2009   Sleep apnea     Past Surgical History Past Surgical History:  Procedure Laterality Date   ABLATION OF DYSRHYTHMIC FOCUS     CATARACT EXTRACTION Left 2018   CHOLECYSTECTOMY  2022   COLON SURGERY     POLYP REMOVED   COLONOSCOPY   86YRS AGO   NECK SURGERY  2009   ACDF   ROTATOR CUFF REPAIR  2004   RT   TONSILLECTOMY  01/29/2012   Procedure: TONSILLECTOMY;  Surgeon: Ascencion Dike, MD;  Location: [facility] OR;  Service: ENT;  Laterality: Bilateral;    Social History: Social History   Tobacco Use   Smoking status: Never  Substance Use Topics   Alcohol use: No   Drug use: No     ROS: Negative for fevers, chills. Positive for headaches. All other systems reviewed and negative unless stated otherwise in HPI.   Physical Exam:   Vital Signs: BP 135/75    Pulse 72    Ht 6' (1.829 m)    Wt 224 lb 12.8 oz (102 kg)    BMI 30.49 kg/m  GENERAL: well appearing,in no acute distress,alert SKIN:  Color, texture,  turgor normal. No rashes or lesions HEAD:  Normocephalic/atraumatic. CV:  RRR RESP: Normal respiratory effort MSK: +tenderness to palpation over right neck  and shoulders  NEUROLOGICAL: Mental Status: Alert, oriented to person, place and time,Follows commands Cranial Nerves: PERRL,visual fields intact to confrontation,extraocular movements intact,facial sensation intact,no facial droop or ptosis,hearing grossly intact,no dysarthria Motor: muscle strength 5/5 both upper and lower extremities,no drift, normal tone Reflexes: 2+ throughout Sensation: intact to light touch all 4 extremities Coordination: Finger-to- nose-finger intact bilaterally Gait: normal-based   IMPRESSION: 76 year old male with a history of HTN, HLD, CAD, neuropathy, CKD, Gilbert's syndrome, cervical stenosis s/p fusion, lumbar stenosis s/p fusion who presents for evaluation of headaches. 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 Frontal headaches are consistent with tension type headaches. Discussed treatment options and he would like to try physical therapy for his neck first as this helped him previously.  PLAN: -ESR, CRP -Start physical therapy for the neck -Next steps: consider PRN muscle relaxer, Cymbalta or gabapentin for prevention, occipital nerve block   I spent a total of 28 minutes chart reviewing and counseling the patient. Headache education was done. Discussed treatment options including preventive and acute medications, and physical therapy. Written educational materials and patient instructions outlining all of the above were given.  Follow-up: 3 months   Genia Harold, MD 11/22/2021   2:34 PM

## 2021-11-23 LAB — SEDIMENTATION RATE: Sed Rate: 8 mm/hr (ref 0–30)

## 2021-11-23 LAB — C-REACTIVE PROTEIN: CRP: 8 mg/L (ref 0–10)

## 2021-11-26 ENCOUNTER — Telehealth: Payer: Self-pay

## 2021-11-26 NOTE — Telephone Encounter (Signed)
I called the pt and left a vm advising of the results.  Pt advised to call back if he had any questions.

## 2021-11-26 NOTE — Telephone Encounter (Signed)
-----   Message from Ocie Doyne, MD sent at 11/23/2021  8:19 AM EST ----- Blood work is normal with no signs of inflammation

## 2021-11-27 ENCOUNTER — Telehealth: Payer: Self-pay | Admitting: Psychiatry

## 2021-11-27 NOTE — Telephone Encounter (Signed)
Referral sent to [facility] [place] [phone].

## 2022-01-23 ENCOUNTER — Telehealth: Payer: Self-pay | Admitting: *Deleted

## 2022-01-23 NOTE — Telephone Encounter (Signed)
Received fax from [facility], re: patient's PT plan. I LVM advising them Dr Delena Bali is out of office 2 weeks, asked if it needs to be signed by other MD. Left # for call back.  ?

## 2022-03-18 ENCOUNTER — Encounter: Payer: Self-pay | Admitting: Psychiatry

## 2022-03-18 ENCOUNTER — Ambulatory Visit: Payer: Medicare Other | Admitting: Psychiatry

## 2022-03-18 VITALS — BP 132/75 | HR 65 | Ht 72.0 in | Wt 225.6 lb

## 2022-03-18 DIAGNOSIS — G629 Polyneuropathy, unspecified: Secondary | ICD-10-CM | POA: Diagnosis not present

## 2022-03-18 DIAGNOSIS — M5481 Occipital neuralgia: Secondary | ICD-10-CM | POA: Diagnosis not present

## 2022-03-18 MED ORDER — BACLOFEN 10 MG PO TABS: 10.0000 mg | ORAL_TABLET | Freq: Three times a day (TID) | ORAL | 6 refills | Status: DC | PRN

## 2022-03-18 NOTE — Patient Instructions (Addendum)
Consider Cymbalta or gabapentin for headaches and neuropathy. Consider occipital nerve block (numbing medicine and steroid injection) Start baclofen 10 mg up to 3 times a day as needed for headaches and neck pain Blood work for causes of neuropathy

## 2022-03-18 NOTE — Progress Notes (Signed)
   CC:  headaches  Follow-up Visit  Last visit: 11/22/21  Brief HPI: 76 year old male with a history of HTN, HLD, CAD, neuropathy, CKD, Gilbert's syndrome, cervical stenosis s/p fusion, lumbar stenosis s/p fusion who follows in clinic for headaches. Exam suggestive of let occipital neuralgia.  At his last visit he was referred to neck PT.  Interval History: Headaches have improved with physical therapy. Neck pain would temporarily improve with PT, but return after a few days. He continues to have neck pain which can radiate down his neck or rarely up behind his ear. He does not have radicular pain down the arms.  He also reports painful paresthesias in bilateral feet. These are most bothersome at night. Wears compression stockings which help somewhat.  ESR/CRP were normal  Current Headache Regimen: Preventative: none Abortive: none  Prior Therapies                                  Cymbalta 30 mg daily - helped for pain Losartan 100 mg daily  Physical Exam:   Vital Signs: BP 132/75   Pulse 65   Ht 6' (1.829 m)   Wt 225 lb 9.6 oz (102.3 kg)   BMI 30.60 kg/m  GENERAL:  well appearing, in no acute distress, alert  SKIN:  Color, texture, turgor normal. No rashes or lesions HEAD:  Normocephalic/atraumatic. RESP: normal respiratory effort MSK:  No gross joint deformities.   NEUROLOGICAL: Mental Status: Alert, oriented to person, place and time, Follows commands, and Speech fluent and appropriate. Cranial Nerves: PERRL, face symmetric, no dysarthria, hearing grossly intact Motor: moves all extremities equally Sensation: Decreased to pin prick in stocking distribution in bilateral feet, decreased vibration bilateral feet up to knees. Proprioception intact Gait: normal-based.  IMPRESSION: 76 year old male with a history of  HTN, HLD, CAD, neuropathy, CKD, Gilbert's syndrome, cervical stenosis s/p fusion, lumbar stenosis s/p fusion who presents for follow up of headaches and  neck pain. His headaches have improved significantly with physical therapy, but he continues to have pain at the base of his neck. He also reports paresthesias in his feet, and exam today is suggestive of peripheral neuropathy. Will check labs for reversible causes of neuropathy today. Discussed occipital nerve block and preventive medications to help with headaches/neck pain and neuropathy. He will think about these options and let me know if he decides to try one. Will start baclofen as needed for neck pain.  PLAN: -Blood work: TSH, A1c, B12, myeloma panel -Start baclofen 10 mg PRN for neck pain -next steps: consider Cymbalta, gabapentin, occipital nerve block   Follow-up: 5 months  I spent a total of 26 minutes on the date of the service. Headache education was done. Discussed treatment options including preventive and acute medications, and occipital nerve blocks. Written educational materials and patient instructions outlining all of the above were given.  Genia Harold, MD 03/18/22 1:57 PM

## 2022-03-21 LAB — MULTIPLE MYELOMA PANEL, SERUM
Albumin SerPl Elph-Mcnc: 4.2 g/dL (ref 2.9–4.4)
Albumin/Glob SerPl: 1.5 (ref 0.7–1.7)
Alpha 1: 0.2 g/dL (ref 0.0–0.4)
Alpha2 Glob SerPl Elph-Mcnc: 0.7 g/dL (ref 0.4–1.0)
B-Globulin SerPl Elph-Mcnc: 1 g/dL (ref 0.7–1.3)
Gamma Glob SerPl Elph-Mcnc: 1.1 g/dL (ref 0.4–1.8)
Globulin, Total: 3 g/dL (ref 2.2–3.9)
IgA/Immunoglobulin A, Serum: 114 mg/dL (ref 61–437)
IgG (Immunoglobin G), Serum: 1172 mg/dL (ref 603–1613)
IgM (Immunoglobulin M), Srm: 28 mg/dL (ref 15–143)
Total Protein: 7.2 g/dL (ref 6.0–8.5)

## 2022-03-21 LAB — TSH: TSH: 1.33 u[IU]/mL (ref 0.450–4.500)

## 2022-03-21 LAB — HEMOGLOBIN A1C
Est. average glucose Bld gHb Est-mCnc: 100 mg/dL
Hgb A1c MFr Bld: 5.1 % (ref 4.8–5.6)

## 2022-03-21 LAB — VITAMIN B12: Vitamin B-12: 1010 pg/mL (ref 232–1245)

## 2022-03-25 ENCOUNTER — Telehealth: Payer: Self-pay

## 2022-03-25 NOTE — Telephone Encounter (Signed)
Contacted pt, informed him las are normal. Advised to call office back with questions as he had none at this time and was appreciative.

## 2022-03-25 NOTE — Telephone Encounter (Signed)
-----   Message from Ocie Doyne, MD sent at 03/25/2022 10:23 AM EDT ----- All of his blood work is normal

## 2022-09-16 NOTE — Progress Notes (Unsigned)
   CC:  headaches  Follow-up Visit  Last visit: 03/18/22  Brief HPI: 76 year old male with a history of HTN, HLD, CAD, neuropathy, CKD, Gilbert's syndrome, cervical stenosis s/p fusion, lumbar stenosis s/p fusion who follows in clinic for headaches. Exam suggestive of let occipital neuralgia.   At his last visit he was started on PRN baclofen for muscle spasms. Interval History: ***   Headache days per month: *** Migraine days per month*** Headache free days per month: ***  Current Headache Regimen: Preventative: *** Abortive: ***   Prior Therapies                                  Baclofen 10 mg PRN Cymbalta 30 mg daily - helped for pain Losartan 100 mg daily Neck PT  Physical Exam:   Vital Signs: There were no vitals taken for this visit. GENERAL:  well appearing, in no acute distress, alert  SKIN:  Color, texture, turgor normal. No rashes or lesions HEAD:  Normocephalic/atraumatic. RESP: normal respiratory effort MSK:  No gross joint deformities.   NEUROLOGICAL: Mental Status: Alert, oriented to person, place and time, Follows commands, and Speech fluent and appropriate. Cranial Nerves: PERRL, face symmetric, no dysarthria, hearing grossly intact Motor: moves all extremities equally Gait: normal-based.  IMPRESSION: ***  PLAN: ***   Follow-up: ***  I spent a total of *** minutes on the date of the service. Headache education was done. Discussed lifestyle modification including increased oral hydration, decreased caffeine, exercise and stress management. Discussed treatment options including preventive and acute medications, natural supplements, and infusion therapy. Discussed medication overuse headache and to limit use of acute treatments to no more than 2 days/week or 10 days/month. Discussed medication side effects, adverse reactions and drug interactions. Written educational materials and patient instructions outlining all of the above were  given.  Ocie Doyne, MD

## 2022-09-17 ENCOUNTER — Ambulatory Visit: Payer: Medicare Other | Admitting: Psychiatry

## 2022-09-17 ENCOUNTER — Encounter: Payer: Self-pay | Admitting: Psychiatry

## 2022-09-17 VITALS — BP 154/77 | HR 59 | Ht 72.0 in | Wt 218.2 lb

## 2022-09-17 DIAGNOSIS — M5481 Occipital neuralgia: Secondary | ICD-10-CM

## 2022-09-17 DIAGNOSIS — G629 Polyneuropathy, unspecified: Secondary | ICD-10-CM | POA: Diagnosis not present

## 2022-09-17 NOTE — Patient Instructions (Addendum)
Supplements that can be tried for "nerve" type pain: 1. Alpha lipoic acid 600mg  daily: Has some research data but actual dose not well established as they used IV in the clinical research trials. No major side effects other than <1% of people report upset stomach. This can be taken twice per day (1200mg  daily) if no relief obtained.  2. Acetyl-L-carnitine 1000mg  3 times daily: this has the most research data backing it with reports of diabetic, HIV and chemotherapy related neuropathy patients reporting improved symptoms. Well tolerated overall but can cause GI upset so take it with food.  3. Fish Oil (750mg  eicosapentaenoic acid, 560mg  docosapentaenoic acid, 1020mg  docosahexaenoic acid) - animal models suggest it can improve neuropathy and potentially stimulate nerve growth. It has also been shown to be beneficial in humans with type I diabetes and neuropathy.  4. Lidocaine cream - 1-2% can be applied to the feet/symptomatic areas several times daily. Wear gloves!  5. Capsaicin cream: Made of chili peppers, this cream burns and is actually rather painful when you first put it on. Mechanism of action is that it overwhelms all of the pain fibers, which theoretically lessens the pain. Wear gloves!  8. Curcumin - up to 600mg  three times daily - somewhat expensive, but can be found on [employer]. Most supplements offer ~1800mg  just to be taken daily. Animal models suggest it may help with neve pain and may also help with weight loss. There are no large data human trials to support the use of this.  9. Vitamin E 10mg  twice daily

## 2022-10-18 ENCOUNTER — Emergency Department (HOSPITAL_COMMUNITY): Payer: Medicare Other

## 2022-10-18 ENCOUNTER — Other Ambulatory Visit: Payer: Self-pay

## 2022-10-18 ENCOUNTER — Encounter (HOSPITAL_COMMUNITY): Payer: Self-pay | Admitting: Emergency Medicine

## 2022-10-18 ENCOUNTER — Emergency Department (HOSPITAL_COMMUNITY)
Admission: EM | Admit: 2022-10-18 | Discharge: 2022-10-18 | Disposition: A | Discharge status: Home / Self Care | Payer: Medicare Other | Attending: Emergency Medicine | Admitting: Emergency Medicine

## 2022-10-18 DIAGNOSIS — Z7982 Long term (current) use of aspirin: Secondary | ICD-10-CM | POA: Diagnosis not present

## 2022-10-18 DIAGNOSIS — I2510 Atherosclerotic heart disease of native coronary artery without angina pectoris: Secondary | ICD-10-CM | POA: Diagnosis not present

## 2022-10-18 DIAGNOSIS — R0789 Other chest pain: Secondary | ICD-10-CM | POA: Diagnosis present

## 2022-10-18 LAB — CBC
HCT: 43.1 % (ref 39.0–52.0)
Hemoglobin: 14.4 g/dL (ref 13.0–17.0)
MCH: 31.6 pg (ref 26.0–34.0)
MCHC: 33.4 g/dL (ref 30.0–36.0)
MCV: 94.7 fL (ref 80.0–100.0)
Platelets: 267 10*3/uL (ref 150–400)
RBC: 4.55 MIL/uL (ref 4.22–5.81)
RDW: 12.7 % (ref 11.5–15.5)
WBC: 8.6 10*3/uL (ref 4.0–10.5)
nRBC: 0 % (ref 0.0–0.2)

## 2022-10-18 LAB — BASIC METABOLIC PANEL
Anion gap: 9 (ref 5–15)
BUN: 18 mg/dL (ref 8–23)
CO2: 24 mmol/L (ref 22–32)
Calcium: 9 mg/dL (ref 8.9–10.3)
Chloride: 106 mmol/L (ref 98–111)
Creatinine, Ser: 1.27 mg/dL — ABNORMAL HIGH (ref 0.61–1.24)
GFR, Estimated: 59 mL/min — ABNORMAL LOW (ref >60)
Glucose, Bld: 103 mg/dL — ABNORMAL HIGH (ref 70–99)
Potassium: 3.9 mmol/L (ref 3.5–5.1)
Sodium: 139 mmol/L (ref 135–145)

## 2022-10-18 LAB — TROPONIN I (HIGH SENSITIVITY)
Troponin I (High Sensitivity): 5 ng/L (ref <18)
Troponin I (High Sensitivity): 4 ng/L (ref <18)

## 2022-10-18 NOTE — ED Triage Notes (Signed)
Pt reports substernal to left sided chest pain that has continued to get worse over the past two weeks.  Denies swelling, N/V/D

## 2022-10-18 NOTE — ED Provider Notes (Signed)
[facility] DEPART[facility] Provider Note   CSN: [id number] Arrival date & time: 10/18/22  0526     History  Chief Complaint  Patient presents with   Chest Pain    Eric Murphy is a 77 y.o. male.  77 year old male with prior medical history as detailed below presents for evaluation.  Patient complains of intermittent left-sided and substernal chest discomfort.  This is been ongoing for the last 1 to 2 weeks.  Patient denies associated nausea, vomiting, diaphoresis.  Patient denies shortness of breath.  Patient reports viral URI symptoms in the week or so after Christmas.  This was associated with significant coughing and congestion.  Those symptoms have resolved but his chest pain has continued intermittently.  Patient with prior medical history significant for known CAD (D1 lesion treated medically, last cath 2007 at [facility], no prior cardiac stent), paroxysmal V. tach requiring ablation in 2009.  Primary cardiology care is performed at [facility].       The history is provided by the patient and medical records.       Home Medications Prior to Admission medications   Medication Sig Start Date End Date Taking? Authorizing Provider  amLODipine (NORVASC) 10 MG tablet Take 10 mg by mouth daily.    [provider]  aspirin 81 MG EC tablet Take 1 tablet by mouth daily. 05/14/10   [provider]  atorvastatin (LIPITOR) 20 MG tablet 20 mg daily. 09/20/21   [provider]  baclofen (LIORESAL) 10 MG tablet Take 1 tablet (10 mg total) by mouth 3 (three) times daily as needed for muscle spasms (headaches, neck pain). 03/18/22   Genia Harold, MD  Coenzyme Q10 100 MG capsule Take 200 mg by mouth daily. 08/26/19   [provider]  DULoxetine (CYMBALTA) 30 MG capsule Take 30 mg by mouth daily.    [provider]  famotidine (PEPCID) 20 MG tablet Take 20 mg by mouth 2 (two) times daily. 08/03/19   [provider]   fluticasone (FLONASE) 50 MCG/ACT nasal spray Place into the nose as needed.    [provider]  losartan (COZAAR) 100 MG tablet Take 100 mg by mouth daily.    [provider]  Multiple Vitamins-Minerals (CENTRUM SILVER PO) Take 1 tablet by mouth daily.    [provider]  sildenafil (VIAGRA) 100 MG tablet TAKE ONE TABLET BY MOUTH  AS NEEDED DO NOT TAKE MORE THAN ONCE DAILY UNLESS OTHERWISE INSTRUCTED 04/23/21   [provider]  Vitamin D, Ergocalciferol, (DRISDOL) 50000 UNITS CAPS Take 50,000 Units by mouth every 7 (seven) days. On Monday    [provider]      Allergies    Shrimp [shellfish allergy], Beta adrenergic blockers, Hydrochlorothiazide, Lipitor [atorvastatin calcium], Lisinopril, Primaquine, and Simvastatin    Review of Systems   Review of Systems  All other systems reviewed and are negative.   Physical Exam Updated Vital Signs BP (!) 169/78   Pulse 67   Temp 98.2 F (36.8 C)   Resp 20   SpO2 100%  Physical Exam Vitals and nursing note reviewed.  Constitutional:      General: He is not in acute distress.    Appearance: Normal appearance. He is well-developed.  HENT:     Head: Normocephalic and atraumatic.  Eyes:     Conjunctiva/sclera: Conjunctivae normal.     Pupils: Pupils are equal, round, and reactive to light.  Cardiovascular:     Rate and Rhythm: Normal rate  and regular rhythm.     Heart sounds: Normal heart sounds.  Pulmonary:     Effort: Pulmonary effort is normal. No respiratory distress.     Breath sounds: Normal breath sounds.  Abdominal:     General: There is no distension.     Palpations: Abdomen is soft.     Tenderness: There is no abdominal tenderness.  Musculoskeletal:        General: No deformity. Normal range of motion.     Cervical back: Normal range of motion and neck supple.  Skin:    General: Skin is warm and dry.  Neurological:     General: No focal deficit present.     Mental Status: He  is alert and oriented to person, place, and time.     ED Results / Procedures / Treatments   Labs (all labs ordered are listed, but only abnormal results are displayed) Labs Reviewed  BASIC METABOLIC PANEL - Abnormal; Notable for the following components:      Result Value   Glucose, Bld 103 (*)    Creatinine, Ser 1.27 (*)    GFR, Estimated 59 (*)    All other components within normal limits  CBC  TROPONIN I (HIGH SENSITIVITY)  TROPONIN I (HIGH SENSITIVITY)    EKG EKG Interpretation  Date/Time:  Friday October 18 2022 05:23:16 EST Ventricular Rate:  62 PR Interval:  160 QRS Duration: 82 QT Interval:  408 QTC Calculation: 414 R Axis:   -2 Text Interpretation: Normal sinus rhythm Normal ECG When compared with ECG of 18-Oct-2022 05:21, PREVIOUS ECG IS PRESENT Confirmed by Kristine Royal [phone]) on 10/18/2022 7:24:10 AM  Radiology DG Chest 2 View  Result Date: 10/18/2022 CLINICAL DATA:  Chest pain. EXAM: CHEST - 2 VIEW COMPARISON:  PA Lat 01/22/2012 FINDINGS: The heart size and mediastinal contours are stable with mild aortic tortuosity and calcification. Both lungs are clear. The visualized skeletal structures are intact. There is thoracic spondylosis. Lower cervical ACDF plating is again partially visible. IMPRESSION: No acute chest disease.  Aortic atherosclerosis. Electronically Signed   By: Almira Bar M.D.   On: 10/18/2022 06:14    Procedures Procedures    Medications Ordered in ED Medications - No data to display  ED Course/ Medical Decision Making/ A&P                           Medical Decision Making Amount and/or Complexity of Data Reviewed Labs: ordered. Radiology: ordered.    Medical Screen Complete  This patient presented to the ED with complaint of chest pain.  This complaint involves an extensive number of treatment options. The initial differential diagnosis includes, but is not limited to, ACS, angina, metabolic abnormality, etc.  This  presentation is: Acute, Self-Limited, Previously Undiagnosed, Uncertain Prognosis, Complicated, Systemic Symptoms, and Threat to Life/Bodily Function  Patient is presenting with complaint of atypical chest pain x 1 week.  Patient's pain seems to be exacerbated with movement and or with cough.  EKG is without acute evidence of acute ischemia.  Troponin x 2 is minimally detectable with without significant delta.  Other screening labs are without significant abnormality.  Chest x-ray is without acute pathology.  Patient feels improved and reassured after ED evaluation.  He desires discharge home.    Patient previously known to do cardiology.  He reports that he would like to be referred to [facility] cardiology.  Seeing cardiology at [facility] will be closer to his  home and would be easier on his schedule.  Patient given strict return instructions.  Importance of close follow-up is repeatedly stressed.  Additional history obtained: External records from outside sources obtained and reviewed including prior ED visits and prior Inpatient records.    Lab Tests:  I ordered and personally interpreted labs.  The pertinent results include: CBC, BMP, troponin x 2   Imaging Studies ordered:  I ordered imaging studies including chest x-ray I independently visualized and interpreted obtained imaging which showed NAD I agree with the radiologist interpretation.   Cardiac Monitoring:  The patient was maintained on a cardiac monitor.  I personally viewed and interpreted the cardiac monitor which showed an underlying rhythm of: NSR   Problem List / ED Course:  Atypical chest pain   Reevaluation:  After the interventions noted above, I reevaluated the patient and found that they have: improved   Disposition:  After consideration of the diagnostic results and the patients response to treatment, I feel that the patent would benefit from close outpatient follow-up.          Final Clinical  Impression(s) / ED Diagnoses Final diagnoses:  Atypical chest pain    Rx / DC Orders ED Discharge Orders     None         Valarie Merino, MD 10/18/22 1043

## 2022-10-18 NOTE — Discharge Instructions (Signed)
Return for any problem.  ?

## 2022-10-18 NOTE — ED Notes (Signed)
Unsuccessful attempt at blood work collection. Will call lab for collection.

## 2022-10-23 ENCOUNTER — Encounter: Payer: Self-pay | Admitting: Cardiology

## 2022-10-23 NOTE — Progress Notes (Signed)
Cardiology Office Note  Date: 10/24/2022   ID: Eric Murphy, DOB 07-10-1946, MRN [id number]  PCP:  Eric Gilles, DO  Cardiologist:  Eric Lesches, MD Electrophysiologist:  None   Chief Complaint  Patient presents with   Chest discomfort    History of Present Illness: Eric Murphy is a 77 y.o. male referred for cardiology consultation by Dr. Francia Murphy after recent ER encounter on January 12 with chest discomfort.  He has actually been followed by [facility] cardiology per record review for several years (was seen by Eric Murphy in October 2023, I reviewed the note).  ECG and cardiac enzymes were reassuring during the recent ER visit.  I see that he has a history of 80% first diagonal stenosis documented at cardiac catheterization back in 2007 and managed medically.  Also history of paroxysmal ventricular tachycardia status post VT ablation in 2009.  He is here with his wife, they live in [place].  He is retired from [employer].  He tells me that since undergoing a left hip injection prior to Christmas he has been experiencing intermittent sense of palpitations and chest discomfort.  The symptoms got worse with a chest cold thereafter and he was ultimately seen in the ER for assessment.  He describes his pain as being at times sharp or needlelike, sometimes dull, feeling of skipping of his heart and also an unusual feeling in his head.  He has had no frank syncope.  No exertional component or otherwise predictable pattern.  He has had similar symptoms to this over the last several years.  He underwent cardiac catheterization in 2018 also at [facility] that demonstrated stable coronary anatomy, no major obstruction in the large or epicardial's with a 60% first diagonal stenosis.  Follow-up Myoview at [facility] in 2020 showed no frank ischemia with questionable posterolateral scar.  I do not see a recent echocardiogram on record review.  He states that he has not been able to tolerate beta-blockers in  the past due to symptomatic bradycardia.  Resting heart rate is in the 60s.  He also has statin myalgias to both Zocor and Lipitor.  He has not been able to take more than 20 mg Lipitor and his LDL is not at all well-controlled, 149 by last check in October 2023.  He tells me that he wants to get a cardiovascular specialist closer to home and is transitioning his care to our practice.  Past Medical History:  Diagnosis Date   BPH (benign prostatic hypertrophy)    CAD (coronary artery disease)    80% first diagonal stenosis managed medically 2007 at [facility]   CKD (chronic kidney disease)    GERD (gastroesophageal reflux disease)    Gilbert's syndrome    H/O vitamin D deficiency    Hyperlipemia    Hypertension    Neuropathy    Paroxysmal ventricular tachycardia ([place])    Status post VT ablation at [facility] in 2009   Prediabetes    Sleep apnea     Past Surgical History:  Procedure Laterality Date   ABLATION OF DYSRHYTHMIC FOCUS     CATARACT EXTRACTION Left 2018   CHOLECYSTECTOMY  2022   COLON SURGERY     POLYP REMOVED   COLONOSCOPY   61YRS AGO   NECK SURGERY  2009   ACDF   ROTATOR CUFF REPAIR  2004   RT   TONSILLECTOMY  01/29/2012   Procedure: TONSILLECTOMY;  Surgeon: Eric Dike, MD;  Location: [facility];  Service: ENT;  Laterality: Bilateral;  Current Outpatient Medications  Medication Sig Dispense Refill   amLODipine (NORVASC) 10 MG tablet Take 10 mg by mouth daily.     aspirin 81 MG EC tablet Take 1 tablet by mouth daily.     atorvastatin (LIPITOR) 20 MG tablet 20 mg daily.     Coenzyme Q10 100 MG capsule Take 200 mg by mouth daily.     famotidine (PEPCID) 20 MG tablet Take 20 mg by mouth 2 (two) times daily.     losartan (COZAAR) 100 MG tablet Take 100 mg by mouth daily.     Multiple Vitamins-Minerals (CENTRUM SILVER PO) Take 1 tablet by mouth daily.     sildenafil (VIAGRA) 100 MG tablet TAKE ONE TABLET BY MOUTH  AS NEEDED DO NOT TAKE MORE THAN ONCE DAILY UNLESS OTHERWISE  INSTRUCTED     Vitamin D, Ergocalciferol, (DRISDOL) 50000 UNITS CAPS Take 50,000 Units by mouth every 7 (seven) days. On Monday     No current facility-administered medications for this visit.   Allergies:  Shrimp [shellfish allergy], Beta adrenergic blockers, Hydrochlorothiazide, Lipitor [atorvastatin calcium], Lisinopril, Primaquine, Shrimp (diagnostic), and Simvastatin   Social History: The patient  reports that he has never smoked. He does not have any smokeless tobacco history on file. He reports that he does not drink alcohol and does not use drugs.   Family History: The patient's family history includes CAD in his brother and father; Dementia in his mother; Diabetes in his brother; Heart failure in his brother and father; Hypertension in his father and sister; Kidney disease in his brother and father.   ROS: No orthopnea or PND.  No claudication.  Does have neuropathy symptoms.  Physical Exam: VS:  BP (!) 144/76   Pulse 65   Ht 6' (1.829 m)   Wt 221 lb 3.2 oz (100.3 kg)   SpO2 100%   BMI 30.00 kg/m , BMI Body mass index is 30 kg/m.  Wt Readings from Last 3 Encounters:  10/24/22 221 lb 3.2 oz (100.3 kg)  09/17/22 218 lb 3.2 oz (99 kg)  03/18/22 225 lb 9.6 oz (102.3 kg)    General: Patient appears comfortable at rest. HEENT: Conjunctiva and lids normal. Neck: Supple, no elevated JVP, left carotid bruit, no thyromegaly. Lungs: Clear to auscultation, nonlabored breathing at rest. Cardiac: Regular rate and rhythm, no S3 or significant systolic murmur, no pericardial rub. Abdomen: Soft, nontender, bowel sounds present. Extremities: No pitting edema, distal pulses 2+. Skin: Warm and dry. Musculoskeletal: No kyphosis. Neuropsychiatric: Alert and oriented x3, affect grossly appropriate.  ECG:  An ECG dated 10/18/2022 was personally reviewed today and demonstrated:  Sinus rhythm.  Recent Labwork: October 2023: Cholesterol 232, LDL 149, HDL 44, triglycerides 193, AST 26, ALT  25 03/18/2022: TSH 1.330 10/18/2022: BUN 18; Creatinine, Ser 1.27; Hemoglobin 14.4; Platelets 267; Potassium 3.9; Sodium 139   Other Studies Reviewed Today:  Exercise Myoview 02/25/2019 ([facility]):  - Adequate treadmill stress achieved   - Question small posterolateral infarct. No evidence for ischemia.   - Normal heart size and LVEF   Holter monitor May 2020 ([facility]): Conclusions: 1)  This 48 hour Holter scan was adequate for interpretation. 2)  The patient was in sinus rhythm, sinus bradycardia, sinus tachycardia . 3)  Ventricular ectopic activity consisted of multifocal PVCs (strips 5,7,8) including couplet (strip  7). 4)  Supraventricular ectopic activity consisted of PACs (strips 6,9). 5)  There were no pauses greater than 2.0 seconds noted. 6)  There were no symptoms noted in diary  nor was the event button activated   Chest x-ray 10/18/2022: FINDINGS: The heart size and mediastinal contours are stable with mild aortic tortuosity and calcification. Both lungs are clear. The visualized skeletal structures are intact. There is thoracic spondylosis. Lower cervical ACDF plating is again partially visible.   IMPRESSION: No acute chest disease.  Aortic atherosclerosis.  Assessment and Plan:  1.  Atypical chest pain and sense of palpitations as discussed above.  He has a history of first diagonal stenosis managed medically and also paroxysmal VT status post ablation with care at [facility] over the years.  He has not had any recent syncope.  Recent ER evaluation showed no clear evidence of ACS or arrhythmia.  Plan at this time is to obtain a follow-up echocardiogram to reevaluate LVEF and proceed with an exercise Myoview (switch to [place] if he is not able to navigate the treadmill).  2.  Branch vessel CAD, reportedly 60% first diagonal stenosis by cardiac catheterization at [facility] in 2018 and otherwise normal major epicardials.  He is currently on aspirin and Lipitor.  3.  Mixed hyperlipidemia with  statin myalgias to both Zocor and Lipitor.  He is currently on Lipitor 20 mg daily and has not been able to tolerate higher doses per discussion.  His most recent LDL was 149 which is clearly not at goal.  We will make referral to the lipid clinic for discussion of PCSK9 inhibitors.  4.  History of paroxysmal VT status post ablation at [facility] back in 2009.  Plan to investigate this further with a 7-day Zio patch in light of his reported sense of palpitations.  5.  Left carotid bruit, obtain carotid Dopplers.  Medication Adjustments/Labs and Tests Ordered: Current medicines are reviewed at length with the patient today.  Concerns regarding medicines are outlined above.   Tests Ordered: Orders Placed This Encounter  Procedures   NM Myocar Multi W/Spect W/Wall Motion / EF   AMB Referral to [facility]   ECHOCARDIOGRAM COMPLETE   VAS US CAROTID    Medication Changes: No orders of the defined types were placed in this encounter.   Disposition:  Follow up  test results.  Signed, Satira Sark, MD, [facility] 10/24/2022 9:43 AM    [facility] [place], [place], [place] [address] Phone: [phone]; Fax: [phone]

## 2022-10-24 ENCOUNTER — Telehealth: Payer: Self-pay | Admitting: Cardiology

## 2022-10-24 ENCOUNTER — Encounter: Payer: Self-pay | Admitting: *Deleted

## 2022-10-24 ENCOUNTER — Ambulatory Visit: Payer: Medicare Other

## 2022-10-24 ENCOUNTER — Encounter: Payer: Self-pay | Admitting: Cardiology

## 2022-10-24 ENCOUNTER — Ambulatory Visit: Payer: Medicare Other | Attending: Cardiology | Admitting: Cardiology

## 2022-10-24 ENCOUNTER — Other Ambulatory Visit: Payer: Self-pay | Admitting: Cardiology

## 2022-10-24 VITALS — BP 144/76 | HR 65 | Ht 72.0 in | Wt 221.2 lb

## 2022-10-24 DIAGNOSIS — R0989 Other specified symptoms and signs involving the circulatory and respiratory systems: Secondary | ICD-10-CM | POA: Diagnosis not present

## 2022-10-24 DIAGNOSIS — R0789 Other chest pain: Secondary | ICD-10-CM

## 2022-10-24 DIAGNOSIS — I4729 Other ventricular tachycardia: Secondary | ICD-10-CM

## 2022-10-24 DIAGNOSIS — I499 Cardiac arrhythmia, unspecified: Secondary | ICD-10-CM

## 2022-10-24 DIAGNOSIS — Z789 Other specified health status: Secondary | ICD-10-CM

## 2022-10-24 DIAGNOSIS — I25119 Atherosclerotic heart disease of native coronary artery with unspecified angina pectoris: Secondary | ICD-10-CM | POA: Diagnosis not present

## 2022-10-24 NOTE — Patient Instructions (Addendum)
Medication Instructions:  Your physician recommends that you continue on your current medications as directed. Please refer to the Current Medication list given to you today.  Labwork: none  Testing/Procedures: Your physician has requested that you have an echocardiogram. Echocardiography is a painless test that uses sound waves to create images of your heart. It provides your doctor with information about the size and shape of your heart and how well your heart's chambers and valves are working. This procedure takes approximately one hour. There are no restrictions for this procedure. Please do NOT wear cologne, perfume, aftershave, or lotions (deodorant is allowed). Please arrive 15 minutes prior to your appointment time. Your physician has requested that you have a carotid duplex. This test is an ultrasound of the carotid arteries in your neck. It looks at blood flow through these arteries that supply the brain with blood. Allow one hour for this exam. There are no restrictions or special instructions. Your physician has requested that you have en exercise stress myoview. For further information please visit [email or web address]. Please follow instruction sheet, as given. Your physician has recommended that you wear a Zio monitor.   This monitor is a medical device that records the heart's electrical activity. Doctors most often use these monitors to diagnose arrhythmias. Arrhythmias are problems with the speed or rhythm of the heartbeat. The monitor is a small device applied to your chest. You can wear one while you do your normal daily activities. While wearing this monitor if you have any symptoms to push the button and record what you felt. Once you have worn this monitor for the period of time provider prescribed (for 7 days), you will return the monitor device in the postage paid box. Once it is returned they will download the data collected and provide [place] with a report which the provider will  then review and we will call you with those results. Important tips:  Avoid showering during the first 24 hours of wearing the monitor. Avoid excessive sweating to help maximize wear time. Do not submerge the device, no hot tubs, and no swimming pools. Keep any lotions or oils away from the patch. After 24 hours you may shower with the patch on. Take brief showers with your back facing the shower head.  Do not remove patch once it has been placed because that will interrupt data and decrease adhesive wear time. Push the button when you have any symptoms and write down what you were feeling. Once you have completed wearing your monitor, remove and place into box which has postage paid and place in your outgoing mailbox.  If for some reason you have misplaced your box then call our office and we can provide another box and/or mail it off for you.  Follow-Up: Your physician recommends that you schedule a follow-up appointment in: pending  Any Other Special Instructions Will Be Listed Below (If Applicable). You have been referred to Pharmacist  If you need a refill on your cardiac medications before your next appointment, please call your pharmacy.

## 2022-10-24 NOTE — Telephone Encounter (Signed)
Checking percert on the following patient for testing scheduled at [facility].   EXERCISE STRESS - 11/05/2022   7 Day ZIO XT dx: h/o VT w/ablation

## 2022-11-05 ENCOUNTER — Ambulatory Visit (HOSPITAL_COMMUNITY)
Admission: RE | Admit: 2022-11-05 | Discharge: 2022-11-05 | Disposition: A | Discharge status: Home / Self Care | Payer: Medicare Other | Source: Ambulatory Visit | Attending: Cardiology | Admitting: Cardiology

## 2022-11-05 ENCOUNTER — Encounter (HOSPITAL_COMMUNITY)
Admission: RE | Admit: 2022-11-05 | Discharge: 2022-11-05 | Disposition: A | Discharge status: Home / Self Care | Payer: Medicare Other | Source: Ambulatory Visit | Attending: Cardiology | Admitting: Cardiology

## 2022-11-05 DIAGNOSIS — I25119 Atherosclerotic heart disease of native coronary artery with unspecified angina pectoris: Secondary | ICD-10-CM | POA: Diagnosis not present

## 2022-11-05 DIAGNOSIS — R0789 Other chest pain: Secondary | ICD-10-CM | POA: Diagnosis not present

## 2022-11-05 LAB — NM MYOCAR MULTI W/SPECT W/WALL MOTION / EF
Angina Index: 0
Duke Treadmill Score: 7
Estimated workload: 7.2
Exercise duration (min): 6 min
Exercise duration (sec): 30 s
LV dias vol: 78 mL (ref 62–150)
LV sys vol: 37 mL
MPHR: 145 {beats}/min
Nuc Stress EF: 52 %
Peak HR: 137 {beats}/min
Percent HR: 94 %
RATE: 0.3
RPE: 15
Rest HR: 52 {beats}/min
Rest Nuclear Isotope Dose: 11 mCi
SDS: 3
SRS: 2
SSS: 5
ST Depression (mm): 0 mm
Stress Nuclear Isotope Dose: 30 mCi
TID: 0.99

## 2022-11-05 MED ORDER — TECHNETIUM TC 99M TETROFOSMIN IV KIT
10.0000 | PACK | Freq: Once | INTRAVENOUS | Status: AC | PRN
  Administered 2022-11-05: 11 via INTRAVENOUS

## 2022-11-05 MED ORDER — SODIUM CHLORIDE FLUSH 0.9 % IV SOLN
INTRAVENOUS | Status: AC
  Administered 2022-11-05: 10 mL via INTRAVENOUS
  Filled 2022-11-05: qty 10

## 2022-11-05 MED ORDER — REGADENOSON 0.4 MG/5ML IV SOLN
INTRAVENOUS | Status: AC
  Filled 2022-11-05: qty 5

## 2022-11-05 MED ORDER — TECHNETIUM TC 99M TETROFOSMIN IV KIT
30.0000 | PACK | Freq: Once | INTRAVENOUS | Status: AC | PRN
  Administered 2022-11-05: 30 via INTRAVENOUS

## 2022-11-11 ENCOUNTER — Ambulatory Visit (INDEPENDENT_AMBULATORY_CARE_PROVIDER_SITE_OTHER): Payer: Medicare Other

## 2022-11-11 ENCOUNTER — Ambulatory Visit: Payer: Medicare Other | Attending: Cardiology

## 2022-11-11 DIAGNOSIS — I25119 Atherosclerotic heart disease of native coronary artery with unspecified angina pectoris: Secondary | ICD-10-CM

## 2022-11-11 DIAGNOSIS — R0989 Other specified symptoms and signs involving the circulatory and respiratory systems: Secondary | ICD-10-CM

## 2022-11-11 LAB — ECHOCARDIOGRAM COMPLETE
AR max vel: 2.08 cm2
AV Peak grad: 6.8 mmHg
AV Vena cont: 0.2 cm
Ao pk vel: 1.3 m/s
Area-P 1/2: 3.68 cm2
Calc EF: 63.2 %
MV M vel: 5.3 m/s
MV Peak grad: 112.4 mmHg
P 1/2 time: 2080 msec
S' Lateral: 2.9 cm
Single Plane A2C EF: 62.2 %
Single Plane A4C EF: 64.8 %

## 2022-11-12 ENCOUNTER — Ambulatory Visit (HOSPITAL_BASED_OUTPATIENT_CLINIC_OR_DEPARTMENT_OTHER): Payer: Medicare Other | Admitting: Pharmacist Clinician (PhC)/ Clinical Pharmacy Specialist

## 2022-11-12 DIAGNOSIS — E785 Hyperlipidemia, unspecified: Secondary | ICD-10-CM | POA: Diagnosis not present

## 2022-11-12 NOTE — Progress Notes (Unsigned)
11/13/2022, 7:37 AM    Office Visit    Patient Name: Eric Murphy Date of Encounter: 11/13/2022  Primary Care Provider:  Sherrilee Gilles, DO Primary Cardiologist:  Rozann Lesches, MD  Chief Complaint    Hyperlipidemia   Significant Past Medical History   CAD 60% first diagonal stenosis, mild bilateral carotid stenosis  HTN 144/76 at last visit, on amlodipine 10, losartan 100  Paroxysmal VT Ablation 2009 at [facility]  BPH   OSA      Allergies  Allergen Reactions   Shrimp [Shellfish Allergy] Anaphylaxis    IODINE IN SHRIMP GIVES THE PATIENT ANAPHYLAXIS   Beta Adrenergic Blockers Other (See Comments)    Heart rate is too low   Hydrochlorothiazide Other (See Comments)    palpitations   Lipitor [Atorvastatin Calcium] Other (See Comments)    Leg muscle cramps   Lisinopril Cough   Primaquine Other (See Comments)    Caused Jaundice   Shrimp (Diagnostic) Hives and Other (See Comments)   Simvastatin Other (See Comments)    Leg muscle cramps    History of Present Illness    Eric Murphy is a 77 y.o. male patient of Dr Domenic Polite, in the office today to discuss options for cholesterol management.   He previously tried simvastatin but had to discontinue secondary to myalgias.  Atorvastatin was started and he has been able to tolerate the 20 mg dose, but attempts to increase this have been met with myalgias.  He thinks there be more current labs at his PCP office in [place].   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[employer], manufacturing  LDL Cholesterol goal:  LDL < 70  Current Medications:  atorvastatin 20   Previously tried:  simvastatin  - myalgias  Family Hx:  father had MI, 2 brothers with MI, (one had heart transplant) both deceased 69 and 33;  mother died at 75 with no heart history, 2 sisters - heathy; children 77, 81, 39, no heart issues    Social Hx: Tobacco:no Alcohol: no  Diet:    mostly home cooked foods; protein is fish and chicken; vegetables daily; occasional snacking on  chips or crackers w/ peanut butter  Exercise: walked daily until developed hip pain - steroid shot worked for 1 week  - had increase chest discomfort after - tachycardia, other steroid symptoms   Accessory Clinical Findings   10/23:  labs in [place] from [facility]  TC 232, TG 193, HDL 44, LDL 149  Lab Results  Component Value Date   ALT 43 02/14/2011   AST 33 02/14/2011   ALKPHOS 61 02/14/2011   BILITOT 1.1 02/14/2011   Lab Results  Component Value Date   CREATININE 1.27 (H) 10/18/2022   BUN 18 10/18/2022   NA 139 10/18/2022   K 3.9 10/18/2022   CL 106 10/18/2022   CO2 24 10/18/2022   Lab Results  Component Value Date   HGBA1C 5.1 03/18/2022    Home Medications    Current Outpatient Medications  Medication Sig Dispense Refill   amLODipine (NORVASC) 10 MG tablet Take 10 mg by mouth daily.     aspirin 81 MG EC tablet Take 1 tablet by mouth daily.     atorvastatin (LIPITOR) 20 MG tablet 20 mg daily.     Coenzyme Q10 100 MG capsule Take 200 mg by mouth daily.     famotidine (PEPCID) 20 MG tablet Take 20 mg by mouth 2 (two) times daily.     losartan (COZAAR) 100 MG tablet Take  100 mg by mouth daily.     Multiple Vitamins-Minerals (CENTRUM SILVER PO) Take 1 tablet by mouth daily.     sildenafil (VIAGRA) 100 MG tablet TAKE ONE TABLET BY MOUTH  AS NEEDED DO NOT TAKE MORE THAN ONCE DAILY UNLESS OTHERWISE INSTRUCTED     Vitamin D, Ergocalciferol, (DRISDOL) 50000 UNITS CAPS Take 50,000 Units by mouth every 7 (seven) days. On Monday     No current facility-administered medications for this visit.     Assessment & Plan    Hyperlipidemia LDL goal <70 Assessment: Patient with ASCVD not at LDL goal of < 70 Most recent LDL 149 on 07/2022 Has been compliant with moderate intensity statin: atorvastatin 20 mg Not able to tolerate higher dose atorvastatin secondary to myalgias Reviewed options for lowering LDL cholesterol, including ezetimibe, PCSK-9 inhibitors, bempedoic acid  and inclisiran.  Discussed mechanisms of action, dosing, side effects, potential decreases in LDL cholesterol and costs.  Also reviewed potential options for patient assistance.  Plan: Patient agreeable to starting Leqvio Repeat labs after:  2 doses Lipid Liver function Will reach out to PCP for updated labs Patient signed benefits investigation for Patrice Paradise, PharmD CPP [facility] [address] [place]  [place], [place] [address] [phone]  11/13/2022, 7:37 AM

## 2022-11-12 NOTE — Patient Instructions (Signed)
Your Results:             Your most recent labs Goal  Total Cholesterol 232 < 200  Triglycerides 193 < 150  HDL (happy/good cholesterol) 44 > 40  LDL (lousy/bad cholesterol 149 < 70   Medication changes:  We will start the process to get Leqvio covered by your insurance.  Once the prior authorization is complete, I will call to let you know and confirm pharmacy information.   You will get one injection on day 1, then day 90, then every 6 months after that.  Lab orders:  We want to repeat labs after 2-3 months.  We will send you a lab order to remind you once we get closer to that time.    Please reach out to me if you have any questions or concerns.  Tommy Medal PharmD [phone].  Thank you for choosing [facility]

## 2022-11-13 ENCOUNTER — Encounter (HOSPITAL_BASED_OUTPATIENT_CLINIC_OR_DEPARTMENT_OTHER): Payer: Self-pay | Admitting: Pharmacist Clinician (PhC)/ Clinical Pharmacy Specialist

## 2022-11-13 DIAGNOSIS — E785 Hyperlipidemia, unspecified: Secondary | ICD-10-CM | POA: Insufficient documentation

## 2022-11-13 NOTE — Assessment & Plan Note (Signed)
Assessment: Patient with ASCVD not at LDL goal of < 70 Most recent LDL 149 on 07/2022 Has been compliant with moderate intensity statin: atorvastatin 20 mg Not able to tolerate higher dose atorvastatin secondary to myalgias Reviewed options for lowering LDL cholesterol, including ezetimibe, PCSK-9 inhibitors, bempedoic acid and inclisiran.  Discussed mechanisms of action, dosing, side effects, potential decreases in LDL cholesterol and costs.  Also reviewed potential options for patient assistance.  Plan: Patient agreeable to starting Leqvio Repeat labs after:  2 doses Lipid Liver function Will reach out to PCP for updated labs Patient signed benefits investigation for [facility]

## 2022-11-22 ENCOUNTER — Telehealth: Payer: Self-pay | Admitting: Cardiology

## 2022-11-22 NOTE — Telephone Encounter (Signed)
Looks like Location manager for test results.  Will try to reach out on Monday.

## 2022-11-22 NOTE — Telephone Encounter (Signed)
Patient states he was returning call. Please advise ?

## 2022-11-22 NOTE — Telephone Encounter (Signed)
Did you guys call this patient ?

## 2022-11-25 NOTE — Telephone Encounter (Signed)
Left message to return call.  

## 2022-11-26 ENCOUNTER — Telehealth: Payer: Self-pay | Admitting: *Deleted

## 2022-11-26 NOTE — Telephone Encounter (Signed)
Merlene Laughter, RN 11/07/2022  4:36 PM EST     Patient informed. Copy sent to PCP   Satira Sark, MD 11/05/2022  4:38 PM EST     Results reviewed.  Rare PVCs during exercise, no VT.  No perfusion defects to indicate ischemia.  LVEF calculated at 52%, would await echocardiogram result however for further assessment of cardiac function.  Overall, a low risk study.

## 2022-11-26 NOTE — Telephone Encounter (Signed)
Satira Sark, MD  Merlene Laughter, RN Cc: Ripley Fraise monitor sent to me separately from [facility] front office.  I cannot tell whether it was actually interpreted in the chart and it was not sent to my inbox.  My interpretation is as follows:  1.  Predominant rhythm is sinus with heart rate ranging from 44 bpm up to 130 bpm and average heart rate 64 bpm. 2.  There were rare PACs including atrial couplets representing less than 1% total beats. 3.  There were rare PVCs representing less than 1% total beats.  Also limited episode of ventricular bigeminy. 4.  No significant arrhythmias or pauses observed.  I requested this monitor to evaluate PVC burden and exclude VT.  Overall reassuring findings.  Would keep follow-up as scheduled.

## 2022-12-10 NOTE — Telephone Encounter (Signed)
Letter mailed 12/06/2022

## 2022-12-11 ENCOUNTER — Other Ambulatory Visit: Payer: Self-pay | Admitting: Pharmacist Clinician (PhC)/ Clinical Pharmacy Specialist

## 2022-12-13 ENCOUNTER — Telehealth: Payer: Self-pay | Admitting: Pharmacy Technician

## 2022-12-13 NOTE — Telephone Encounter (Addendum)
Tery Sanfilippo note:  Auth Submission: APPROVED Payer: BCBS Medicace ADVT Medication & CPT/J Code(s) submitted: Marion Downer Christin Bach) [phone] Route of submission (phone, fax, portal):  Phone [phone] Fax [phone] Auth type: Buy/Bill Units/visits requested: 2 Reference number: [id number] REF: [id number] Approval from: 12/12/22 to 06/09/23   Patient will be responsible for 5% co-insurance. Deductible: $150 which has been met. Once $1500 co-insurance has been met leqvio will be cover 100%.  Patient will need NPAF - [facility] has been notified.  Patient will be scheduled as soon as possible

## 2022-12-16 NOTE — Telephone Encounter (Signed)
Patient is following up requesting to discuss results.

## 2022-12-20 ENCOUNTER — Other Ambulatory Visit: Payer: Self-pay | Admitting: Orthopaedic Surgery

## 2022-12-20 DIAGNOSIS — Z01818 Encounter for other preprocedural examination: Secondary | ICD-10-CM

## 2022-12-24 ENCOUNTER — Other Ambulatory Visit: Payer: Self-pay

## 2022-12-26 ENCOUNTER — Ambulatory Visit (INDEPENDENT_AMBULATORY_CARE_PROVIDER_SITE_OTHER): Payer: Medicare Other | Admitting: *Deleted

## 2022-12-26 ENCOUNTER — Telehealth: Payer: Self-pay | Admitting: *Deleted

## 2022-12-26 VITALS — BP 166/77 | HR 72 | Temp 97.7°F | Resp 16 | Ht 72.0 in | Wt 219.8 lb

## 2022-12-26 DIAGNOSIS — E785 Hyperlipidemia, unspecified: Secondary | ICD-10-CM

## 2022-12-26 MED ORDER — INCLISIRAN SODIUM 284 MG/1.5ML ~~LOC~~ SOSY
284.0000 mg | PREFILLED_SYRINGE | Freq: Once | SUBCUTANEOUS | Status: AC
  Administered 2022-12-26: 284 mg via SUBCUTANEOUS
  Filled 2022-12-26: qty 1.5

## 2022-12-26 NOTE — Telephone Encounter (Signed)
   Name: Eric Murphy  DOB: 1946/03/18  MRN: [id number]  Primary Cardiologist: Rozann Lesches, MD  Chart reviewed as part of pre-operative protocol coverage. Because of Eric Murphy's past medical history and time since last visit, he will require a follow-up in-office visit in order to better assess preoperative cardiovascular risk.  Pre-op covering staff: - Please schedule appointment and call patient to inform them. If patient already had an upcoming appointment within acceptable timeframe, please add "pre-op clearance" to the appointment notes so provider is aware. - Please contact requesting surgeon's office via preferred method (i.e, phone, fax) to inform them of need for appointment prior to surgery.  ASA hold will be determined at the time of his appointment.   Elgie Collard, PA-C  12/26/2022, 11:22 AM

## 2022-12-26 NOTE — Progress Notes (Signed)
Diagnosis: Hyperlipidemia  Provider:  Praveen Mannam MD  Procedure: Injection  Leqvio (inclisiran), Dose: 284 mg, Site: subcutaneous, Number of injections: 1  Post Care: Observation period completed  Discharge: Condition: Good, Destination: Home . AVS Provided and AVS Declined  Performed by:  Jullianna Gabor A, RN       

## 2022-12-26 NOTE — Telephone Encounter (Signed)
I left a very detailed message about appt date and time, provider and location. I will update the surgeon as well the pt has appt 01/02/23 @ 9 am with Finis Bud, NP.   Left message if the pt has to reschedule app to call the [place] office.

## 2022-12-26 NOTE — Telephone Encounter (Signed)
   Pre-operative Risk Assessment    Patient Name: Eric Murphy  DOB: 02-27-1946 MRN: [id number]      Request for Surgical Clearance    Procedure:   LEFT REVERSE TOTAL SHOULDER REPLACEMENT   Date of Surgery:  Clearance TBD                                 Surgeon:  DR. Ophelia Charter Surgeon's Group or Practice Name:  [facility] Phone number:  [phone] EXT [id number] ATTN: Derek Jack Fax number:  [phone]   Type of Clearance Requested:   - Medical ; ASA   Type of Anesthesia:  General WITH INTERSCALENE BLOCK   Additional requests/questions:    Jiles Prows   12/26/2022, 11:10 AM

## 2022-12-27 NOTE — Telephone Encounter (Signed)
Left message for the pt again to call back and either confirm appt is good that has been set for 01/02/23 or if her needs to reschedule appt. I will update all parties involved in this matter.

## 2022-12-31 ENCOUNTER — Ambulatory Visit
Admission: RE | Admit: 2022-12-31 | Discharge: 2022-12-31 | Disposition: A | Discharge status: Home / Self Care | Payer: Medicare Other | Source: Ambulatory Visit | Attending: Orthopaedic Surgery | Admitting: Orthopaedic Surgery

## 2022-12-31 DIAGNOSIS — Z01818 Encounter for other preprocedural examination: Secondary | ICD-10-CM

## 2023-01-02 ENCOUNTER — Ambulatory Visit: Payer: Medicare Other | Admitting: Nurse Practitioner

## 2023-01-16 ENCOUNTER — Ambulatory Visit: Payer: Medicare Other | Attending: Nurse Practitioner | Admitting: Nurse Practitioner

## 2023-01-16 ENCOUNTER — Encounter: Payer: Self-pay | Admitting: Nurse Practitioner

## 2023-01-16 VITALS — BP 132/78 | HR 64 | Ht 72.0 in | Wt 218.0 lb

## 2023-01-16 DIAGNOSIS — I6523 Occlusion and stenosis of bilateral carotid arteries: Secondary | ICD-10-CM | POA: Diagnosis not present

## 2023-01-16 DIAGNOSIS — Z01810 Encounter for preprocedural cardiovascular examination: Secondary | ICD-10-CM

## 2023-01-16 DIAGNOSIS — E782 Mixed hyperlipidemia: Secondary | ICD-10-CM | POA: Diagnosis not present

## 2023-01-16 DIAGNOSIS — I2510 Atherosclerotic heart disease of native coronary artery without angina pectoris: Secondary | ICD-10-CM | POA: Diagnosis not present

## 2023-01-16 DIAGNOSIS — I4729 Other ventricular tachycardia: Secondary | ICD-10-CM

## 2023-01-16 NOTE — Patient Instructions (Signed)
Medication Instructions:  Continue all current medications.   Labwork: none  Testing/Procedures: none  Follow-Up: 6 months   Any Other Special Instructions Will Be Listed Below (If Applicable).   If you need a refill on your cardiac medications before your next appointment, please call your pharmacy.  

## 2023-01-16 NOTE — Progress Notes (Signed)
Office Visit    Patient Name: Eric Murphy Date of Encounter: 01/16/2023  PCP:  Jonathon Bellows, DO   [facility]  Cardiologist:  Nona Dell, MD  Advanced Practice Provider:  Sharlene Dory, NP Electrophysiologist:  None   Chief Complaint    Eric Murphy is a very pleasant 77 y.o. male with a hx of CAD, history of chest pain, palpitations, mixed hyperlipidemia, paroxysmal VT, s/p ablation in 2009, carotid artery stenosis, presents today for preoperative cardiovascular risk assessment.  Past Medical History    Past Medical History:  Diagnosis Date   BPH (benign prostatic hypertrophy)    CAD (coronary artery disease)    80% first diagonal stenosis managed medically 2007 at [facility]   CKD (chronic kidney disease)    GERD (gastroesophageal reflux disease)    Gilbert's syndrome    H/O vitamin D deficiency    Hyperlipemia    Hypertension    Neuropathy    Paroxysmal ventricular tachycardia    Status post VT ablation at [facility] in 2009   Prediabetes    Sleep apnea    Past Surgical History:  Procedure Laterality Date   ABLATION OF DYSRHYTHMIC FOCUS     CATARACT EXTRACTION Left 2018   CHOLECYSTECTOMY  2022   COLON SURGERY     POLYP REMOVED   COLONOSCOPY   42YRS AGO   NECK SURGERY  2009   ACDF   ROTATOR CUFF REPAIR  2004   RT   TONSILLECTOMY  01/29/2012   Procedure: TONSILLECTOMY;  Surgeon: Darletta Moll, MD;  Location: [facility] OR;  Service: ENT;  Laterality: Bilateral;    Allergies  Allergies  Allergen Reactions   Shrimp [Shellfish Allergy] Anaphylaxis    IODINE IN SHRIMP GIVES THE PATIENT ANAPHYLAXIS   Beta Adrenergic Blockers Other (See Comments)    Heart rate is too low   Hydrochlorothiazide Other (See Comments)    palpitations   Lipitor [Atorvastatin Calcium] Other (See Comments)    Leg muscle cramps   Lisinopril Cough   Primaquine Other (See Comments)    Caused Jaundice   Shrimp (Diagnostic) Hives and Other (See Comments)   Simvastatin  Other (See Comments)    Leg muscle cramps    History of Present Illness    Eric Murphy is a very pleasant 77 y.o. male with a PMH as mentioned above.  Previously followed by [facility] cardiology for several years.  Previous history of 80% first diagonal stenosis in 2007, recommended be medically managed.  Underwent ablation for paroxysmal ventricular tachycardia in 2009.  Repeat cardiac catheterization 2018 with [facility] revealed stable coronary anatomy 60% first diagonal stenosis.  Myoview in 2020 was negative for ischemia, questionable posterolateral scar.  Visited ED in January of this year with chest discomfort.  EKG/cardiac enzymes unremarkable.  Last seen by Dr. Diona Browner on 10/24/2022.  Noted ever since he had a hip injection, was experiencing palpitations/chest discomfort.  Symptoms got worse after chest cold, was seen in ED for evaluation.  Pain described as sharp/sometimes dull.  Denied syncope, denied any exertional symptoms or predictable pattern.  History of not being able to tolerate beta-blockers due to symptomatic bradycardia.  Resting heart rate 60s.  Unable to tolerate statins due to myalgias.  At that time, was requesting cardiac care closer to home.  Myoview, carotid Doppler, and echocardiogram arranged-results noted below.  Today he presents for preoperative cardiovascular risk assessment.  He is pending left reverse total shoulder replacement by Dr. Ramond Marrow of [facility].  Surgery date is March 26, 2023.  Our office has been contacted regarding medical clearance.  Today he states he is doing well. His previous symptoms have resolved and attributes them to his past hip injection. Very active. Denies any chest pain, shortness of breath, palpitations, syncope, presyncope, dizziness, orthopnea, PND, swelling or significant weight changes, acute bleeding, or claudication.  SH: Retired from [employer].  EKGs/Labs/Other Studies Reviewed:   The following studies were reviewed  today:   EKG:  EKG is ordered today.  The ekg ordered today demonstrates SB, no acute ischemic changes.   Echo 11/2022:  1. Left ventricular ejection fraction, by estimation, is 60 to 65%. The  left ventricle has normal function. The left ventricle has no regional  wall motion abnormalities. Left ventricular diastolic parameters are  consistent with Grade I diastolic  dysfunction (impaired relaxation). The average left ventricular global  longitudinal strain is -19.4 %. The global longitudinal strain is normal.   2. Right ventricular systolic function is normal. The right ventricular  size is normal. There is normal pulmonary artery systolic pressure. The  estimated right ventricular systolic pressure is 24.5 mmHg.   3. The mitral valve is normal in structure. No evidence of mitral valve  regurgitation. No evidence of mitral stenosis.   4. The aortic valve is tricuspid. Aortic valve regurgitation is trivial.  No aortic stenosis is present.   5. The inferior vena cava is normal in size with greater than 50%  respiratory variability, suggesting right atrial pressure of 3 mmHg.   Comparison(s): No prior Echocardiogram. Nuclear stress test done 11/05/22  showed an EF of 52%.  Carotid duplex 10/11/2022:  Summary:  Right Carotid: Velocities in the right ICA are consistent with a 1-39% stenosis.   Left Carotid: Velocities in the left ICA are consistent with a 1-39%  stenosis. The ECA appears >50% stenosed.   Vertebrals:  Bilateral vertebral arteries demonstrate antegrade flow.  Subclavians: Normal flow hemodynamics were seen in bilateral subclavian arteries.  Myoview 10/2022:    No ST deviation was noted but rare PVC occurred during Bruce protocol. No angina during the test.   Left ventricular function is abnormal. Global function is mildly reduced. There were no regional wall motion abnormalities. Nuclear stress EF: 52 %. End diastolic cavity size is normal. End systolic cavity size is  normal.   LV perfusion is normal. There is no evidence of ischemia. There is no evidence of infarction.   The study is normal. [facility] Treadmill score is +6. The study is low risk.  Recent Labs: 03/18/2022: TSH 1.330 10/18/2022: BUN 18; Creatinine, Ser 1.27; Hemoglobin 14.4; Platelets 267; Potassium 3.9; Sodium 139  Recent Lipid Panel No results found for: "CHOL", "TRIG", "HDL", "CHOLHDL", "VLDL", "LDLCALC", "LDLDIRECT"  Risk Assessment/Calculations:    The 10-year ASCVD risk score (Arnett DK, et al., 2019) is: 26%   Values used to calculate the score:     Age: [age] years     Sex: Male     Is Non-Hispanic African American: Yes     Diabetic: No     Tobacco smoker: No     Systolic Blood Pressure: 132 mmHg     Is BP treated: Yes     HDL Cholesterol: 39 mg/dL     Total Cholesterol: 212 mg/dL   Home Medications   Current Meds  Medication Sig   amLODipine (NORVASC) 10 MG tablet Take 10 mg by mouth daily.   aspirin 81 MG EC tablet Take 1 tablet by mouth daily.  atorvastatin (LIPITOR) 20 MG tablet 20 mg daily.   cetirizine (ZYRTEC) 10 MG tablet Take 10 mg by mouth daily.   Coenzyme Q10 100 MG capsule Take 200 mg by mouth daily.   famotidine (PEPCID) 20 MG tablet Take 20 mg by mouth 2 (two) times daily.   fluticasone (FLONASE) 50 MCG/ACT nasal spray Place into both nostrils as needed for allergies or rhinitis.   inclisiran (LEQVIO) 284 MG/1.5ML SOSY injection Inject 284 mg into the skin.   losartan (COZAAR) 100 MG tablet Take 100 mg by mouth daily.   Multiple Vitamins-Minerals (CENTRUM SILVER PO) Take 1 tablet by mouth daily.   sildenafil (VIAGRA) 100 MG tablet TAKE ONE TABLET BY MOUTH  AS NEEDED DO NOT TAKE MORE THAN ONCE DAILY UNLESS OTHERWISE INSTRUCTED   Vitamin D, Ergocalciferol, (DRISDOL) 50000 UNITS CAPS Take 50,000 Units by mouth every 7 (seven) days. On Monday     Review of Systems    All other systems reviewed and are otherwise negative except as noted above.  Physical Exam     VS:  BP 132/78   Pulse 64   Ht 6' (1.829 m)   Wt 218 lb (98.9 kg)   SpO2 97%   BMI 29.57 kg/m  , BMI Body mass index is 29.57 kg/m.  Wt Readings from Last 3 Encounters:  01/16/23 218 lb (98.9 kg)  12/26/22 219 lb 12.8 oz (99.7 kg)  10/24/22 221 lb 3.2 oz (100.3 kg)     GEN: Well nourished, well developed, in no acute distress. HEENT: normal. Neck: Supple, no JVD, minimal left carotid bruit, no right carotid bruit, no masses. Cardiac: S1/S2, slow rate and regular rhythm, no murmurs, rubs, or gallops. No clubbing, cyanosis, edema.  Radials/PT 2+ and equal bilaterally.  Respiratory:  Respirations regular and unlabored, clear to auscultation bilaterally. MS: No deformity or atrophy. Skin: Warm and dry, no rash. Neuro:  Strength and sensation are intact. Psych: Normal affect.  Assessment & Plan    Pre-operative cardiovascular risk assessment Mr. Maradiaga's perioperative risk of a major cardiac event is 0.4% according to the Revised Cardiac Risk Index (RCRI).  Therefore, he is at low risk for perioperative complications.   His functional capacity is excellent at 9.89 METs according to the [facility] Activity Status Index (DASI). Recommendations: According to ACC/AHA guidelines, no further cardiovascular testing needed.  The patient may proceed to surgery at acceptable risk.   Antiplatelet and/or Anticoagulation Recommendations: Aspirin can be held for 7 days prior to his surgery.  Please resume Aspirin post operatively when it is felt to be safe from a bleeding standpoint. Will route note to PCP and requesting party.   2. CAD Stable with no anginal symptoms. No indication for ischemic evaluation. Continue current medication regimen. Heart healthy diet and regular cardiovascular exercise encouraged.   3. Mixed hyperlipidemia, carotid artery stenosis Has seen lipid clinic, agreeable to starting Leqvio. Continue atorvastatin and Leqvio. Carotid dopplers 11/2022 revealed 1-39% bilateral  stenosis. Denies any symptoms. Continue to observe for now. Heart healthy diet and regular cardiovascular exercise encouraged.  Continue to follow with PCP.  4. Paroxysmal VT, s/p ablation in 2009 Past monitor unremarkable. Denies any palpitations. Not on any beta blockers due to hx of symptomatic bradycardia. No medication changes. Heart healthy diet and regular cardiovascular exercise encouraged.   Disposition: Follow up in 6 month(s) with Nona Dell, MD or APP.  Signed, Sharlene Dory, NP 01/16/2023, 4:21 PM [facility]

## 2023-06-02 ENCOUNTER — Ambulatory Visit (INDEPENDENT_AMBULATORY_CARE_PROVIDER_SITE_OTHER): Payer: Medicare Other

## 2023-06-02 VITALS — BP 172/80 | HR 65 | Temp 97.6°F | Resp 18 | Ht 72.0 in | Wt 217.2 lb

## 2023-06-02 DIAGNOSIS — E785 Hyperlipidemia, unspecified: Secondary | ICD-10-CM

## 2023-06-02 MED ORDER — INCLISIRAN SODIUM 284 MG/1.5ML ~~LOC~~ SOSY
284.0000 mg | PREFILLED_SYRINGE | Freq: Once | SUBCUTANEOUS | Status: AC
  Administered 2023-06-02: 284 mg via SUBCUTANEOUS
  Filled 2023-06-02: qty 1.5

## 2023-06-02 NOTE — Progress Notes (Signed)
Diagnosis: Hyperlipidemia  Provider:  Chilton Greathouse MD  Procedure: Injection  Leqvio (inclisiran), Dose: 284 mg, Site: subcutaneous, Number of injections: 1  Administered in right arm.  Post Care: Patient declined observation  Discharge: Condition: Good, Destination: Home . AVS Declined  Performed by:  Wyvonne Lenz, RN

## 2023-07-22 ENCOUNTER — Ambulatory Visit: Payer: Medicare Other | Admitting: Cardiology

## 2023-10-14 ENCOUNTER — Encounter: Payer: Self-pay | Admitting: Cardiology

## 2023-10-14 ENCOUNTER — Ambulatory Visit: Payer: Medicare Other | Attending: Cardiology | Admitting: Cardiology

## 2023-10-14 VITALS — BP 122/64 | HR 65 | Ht 72.0 in | Wt 226.4 lb

## 2023-10-14 DIAGNOSIS — E782 Mixed hyperlipidemia: Secondary | ICD-10-CM

## 2023-10-14 DIAGNOSIS — I4729 Other ventricular tachycardia: Secondary | ICD-10-CM | POA: Diagnosis not present

## 2023-10-14 DIAGNOSIS — I25119 Atherosclerotic heart disease of native coronary artery with unspecified angina pectoris: Secondary | ICD-10-CM | POA: Diagnosis not present

## 2023-10-14 DIAGNOSIS — I6523 Occlusion and stenosis of bilateral carotid arteries: Secondary | ICD-10-CM

## 2023-10-14 NOTE — Progress Notes (Signed)
    Cardiology Office Note  Date: 10/14/2023   ID: Eric Murphy, DOB 12/23/1945, MRN [id number]  History of Present Illness: Eric Murphy is a 78 y.o. male last seen in April by Ms. Miriam NP, I reviewed the note.  He is here for a follow-up visit.  Reports no angina, no increasing sense of palpitations, no dizziness or syncope.  Cardiovascular testing from last year is described below.  He did undergo left shoulder surgery last year, no obvious perioperative cardiac complications.  I reviewed his medications.  Current cardiovascular regimen includes aspirin, Norvasc, Lipitor, Leqvio , Jardiance, and Cozaar.  He had follow-up lab work in November 2024 at which point LDL was down to 33.  Physical Exam: VS:  BP 122/64   Pulse 65   Ht 6' (1.829 m)   Wt 226 lb 6.4 oz (102.7 kg)   SpO2 98%   BMI 30.71 kg/m , BMI Body mass index is 30.71 kg/m.  Wt Readings from Last 3 Encounters:  10/14/23 226 lb 6.4 oz (102.7 kg)  06/02/23 217 lb 3.2 oz (98.5 kg)  01/16/23 218 lb (98.9 kg)    General: Patient appears comfortable at rest. HEENT: Conjunctiva and lids normal. Neck: Supple, no elevated JVP, soft left carotid bruit. Lungs: Clear to auscultation, nonlabored breathing at rest. Cardiac: Regular rate and rhythm, no S3 or significant systolic murmur. Extremities: No pitting edema.  ECG:  An ECG dated 01/16/2023 was personally reviewed today and demonstrated:  Sinus bradycardia.  Labwork: 10/18/2022: BUN 18; Creatinine, Ser 1.27; Hemoglobin 14.4; Platelets 267; Potassium 3.9; Sodium 139  November 2024: Potassium 4.8, creatinine 1.2, cholesterol 94, triglycerides 72, HDL 47, LDL 33  Other Studies Reviewed Today:  No interval cardiac testing for review today.  Assessment and Plan:  1.  Branch vessel CAD, reportedly 60% first diagonal stenosis by cardiac catheterization at [facility] in 2018 and otherwise normal major epicardials.  Lexiscan  Myoview  in January 2024 demonstrated no ischemia and  echocardiogram in February 2024 revealed LVEF 60 to 65%.  He does not describe any angina at this time and continues on medical therapy including aspirin, Jardiance, Leqvio  and Lipitor.   2.  Mixed hyperlipidemia with statin myalgias.  He tolerates low-dose Lipitor and is now on Leqvio  as well with excellent LDL control.   3.  History of paroxysmal VT status post ablation at [facility] back in 2009.  Follow-up cardiac monitor in January 2024 showed less than 1% PVCs.   4.  Left carotid bruit, carotid Dopplers from February 2024 showed mild bilateral ICA stenosis.  Disposition:  Follow up  6 months.  Signed, Jayson JUDITHANN Sierras, M.D., F.A.C.C. [facility] at [facility]

## 2023-10-14 NOTE — Patient Instructions (Addendum)

## 2023-11-19 ENCOUNTER — Telehealth: Payer: Self-pay | Admitting: Pharmacy Technician

## 2023-11-19 NOTE — Telephone Encounter (Addendum)
Auth Submission: APPROVED Site of care: Site of care: [facility] Payer: BCBS MEDICARE/[place] Medication & CPT/J Code(s) submitted: Leqvio (Inclisiran) [id number] Route of submission (phone, fax, portal):  Phone # [phone] Fax #[phone] Auth type: Buy/Bill PB Units/visits requested: 2 DOSES Reference number: INIT - [id number] Approval from: 2/11/2 to 11/17/24      [employer]: Pending [employer] aware

## 2023-12-04 ENCOUNTER — Ambulatory Visit (INDEPENDENT_AMBULATORY_CARE_PROVIDER_SITE_OTHER): Payer: Medicare Other

## 2023-12-04 VITALS — BP 149/77 | HR 57 | Temp 98.0°F | Resp 18 | Ht 72.0 in | Wt 219.6 lb

## 2023-12-04 DIAGNOSIS — E785 Hyperlipidemia, unspecified: Secondary | ICD-10-CM

## 2023-12-04 MED ORDER — INCLISIRAN SODIUM 284 MG/1.5ML ~~LOC~~ SOSY
284.0000 mg | PREFILLED_SYRINGE | Freq: Once | SUBCUTANEOUS | Status: AC
  Administered 2023-12-04: 284 mg via SUBCUTANEOUS
  Filled 2023-12-04: qty 1.5

## 2023-12-04 NOTE — Progress Notes (Signed)
 Diagnosis: Hyperlipidemia  Provider:  Chilton Greathouse MD  Procedure: Injection  Leqvio (inclisiran), Dose: 284 mg, Site: subcutaneous, Number of injections: 1  Injection Site(s): Left arm  Post Care:  Lt arm injection  Discharge: Condition: Good, Destination: Home . AVS Provided  Performed by:  Nat Math, RN

## 2024-06-02 ENCOUNTER — Ambulatory Visit: Payer: Medicare Other

## 2024-06-02 VITALS — BP 165/75 | HR 51 | Temp 97.6°F | Resp 16 | Ht 72.0 in | Wt 218.8 lb

## 2024-06-02 DIAGNOSIS — E785 Hyperlipidemia, unspecified: Secondary | ICD-10-CM | POA: Diagnosis not present

## 2024-06-02 MED ORDER — INCLISIRAN SODIUM 284 MG/1.5ML ~~LOC~~ SOSY
284.0000 mg | PREFILLED_SYRINGE | Freq: Once | SUBCUTANEOUS | Status: AC
  Administered 2024-06-02: 284 mg via SUBCUTANEOUS
  Filled 2024-06-02: qty 1.5

## 2024-06-02 NOTE — Progress Notes (Signed)
 Diagnosis: Iron Deficiency Anemia  Provider:  Mannam, Praveen MD  Procedure: Injection  Leqvio  (inclisiran), Dose: 284 mg, Site: subcutaneous, Number of injections: 1  Injection Site(s): Left arm  Post Care: left arm injection  Discharge: Condition: Good, Destination: Home . AVS Declined  Performed by:  Maximiano JONELLE Pouch, LPN

## 2024-08-25 ENCOUNTER — Ambulatory Visit: Attending: Cardiology | Admitting: Cardiology

## 2024-08-25 ENCOUNTER — Encounter: Payer: Self-pay | Admitting: Cardiology

## 2024-08-25 VITALS — BP 138/74 | HR 58 | Ht 72.0 in | Wt 215.4 lb

## 2024-08-25 DIAGNOSIS — I25119 Atherosclerotic heart disease of native coronary artery with unspecified angina pectoris: Secondary | ICD-10-CM | POA: Diagnosis not present

## 2024-08-25 DIAGNOSIS — I472 Ventricular tachycardia, unspecified: Secondary | ICD-10-CM

## 2024-08-25 DIAGNOSIS — E782 Mixed hyperlipidemia: Secondary | ICD-10-CM | POA: Diagnosis not present

## 2024-08-25 NOTE — Patient Instructions (Signed)
 Medication Instructions:  Your physician recommends that you continue on your current medications as directed. Please refer to the Current Medication list given to you today.   Labwork: NONE TODAY  Testing/Procedures: NONE TODAY  Follow-Up: 6 MONTHS Dr.McDowell  Any Other Special Instructions Will Be Listed Below (If Applicable).  If you need a refill on your cardiac medications before your next appointment, please call your pharmacy.

## 2024-08-25 NOTE — Progress Notes (Signed)
    Cardiology Office Note  Date: 08/25/2024   ID: Eric Murphy, DOB 10/09/1945, MRN [id number]  History of Present Illness: Eric Murphy is a 78 y.o. male last seen in January.  He is here for a routine visit.  Reports no angina or increasing dyspnea exertion with typical activities.  No palpitations or sudden syncope.  We went over his medications which are stable from a cardiac perspective.  Lipid control has been excellent on combination of Leqvio  and tolerated dose of Lipitor.  He had follow-up carotid studies through PCP in [place] that were reassuring this year.  I reviewed his ECG today which shows sinus bradycardia at 58 bpm with increased voltage.  Physical Exam: VS:  BP 138/74 (BP Location: Right Arm)   Pulse (!) 58   Ht 6' (1.829 m)   Wt 215 lb 6.4 oz (97.7 kg)   SpO2 97%   BMI 29.21 kg/m , BMI Body mass index is 29.21 kg/m.  Wt Readings from Last 3 Encounters:  08/25/24 215 lb 6.4 oz (97.7 kg)  06/02/24 218 lb 12.8 oz (99.2 kg)  12/04/23 219 lb 9.6 oz (99.6 kg)    General: Patient appears comfortable at rest. HEENT: Conjunctiva and lids normal. Neck: Supple, no elevated JVP or carotid bruits. Lungs: Clear to auscultation, nonlabored breathing at rest. Cardiac: Regular rate and rhythm, no S3 or significant systolic murmur.  ECG:  An ECG dated 01/16/2023 was personally reviewed today and demonstrated:  Sinus bradycardia.  Labwork:  November 2024: Potassium 4.8, creatinine 1.2, cholesterol 94, triglycerides 72, HDL 47, LDL 33   Other Studies Reviewed Today:  No interval cardiac testing for review today.  Assessment and Plan:  1.  Branch vessel CAD, reportedly 60% first diagonal stenosis by cardiac catheterization at [facility] in 2018 and otherwise normal major epicardials.  Lexiscan  Myoview  in January 2024 demonstrated no ischemia and echocardiogram in February 2024 revealed LVEF 60 to 65%.  He is clinically stable without active angina.  Continue observation.   Current regimen includes aspirin 81 mg daily, Lipitor 20 mg daily, Leqvio  284 mg as directed, and Jardiance 10 mg daily.   2.  Mixed hyperlipidemia with statin myalgias.  He has done well on combination of Lipitor 20 mg daily and Leqvio  284 mg as directed.  LDL 33 in November 2024.   3.  History of paroxysmal VT status post ablation at [facility] back in 2009.  Follow-up cardiac monitor in January 2024 showed less than 1% PVCs.  No active palpitations or sudden syncope.   4.  Left carotid bruit, carotid Dopplers from February 2024 showed mild bilateral ICA stenosis.  Had follow-up imaging through PCP this year that was also reassuring.  Disposition:  Follow up 6 months.  Signed, Jayson JUDITHANN Sierras, M.D., F.A.C.C. [facility] at [facility]

## 2024-10-28 ENCOUNTER — Telehealth: Payer: Self-pay | Admitting: Pharmacy Technician

## 2024-10-28 NOTE — Telephone Encounter (Addendum)
 Auth Submission: approved Site of care: Site of care: [facility] Payer: BCBS Medication & CPT/J Code(s) submitted: Leqvio  (Inclisiran) J1306 Diagnosis Code: E78.5 Route of submission (phone, fax, portal):  Phone #[phone] Fax # [phone] Auth type: Buy/Bill PB Units/visits requested: 284mg  q18months x2 Reference number: [id number] Approval from: 10/26/24 - 10/26/25

## 2024-12-03 ENCOUNTER — Ambulatory Visit
